# Patient Record
Sex: Male | Born: 1975
Health system: Southern US, Community
[De-identification: ages and names within clinical notes are randomized; demographics above are authoritative.]

## PROBLEM LIST (undated history)

## (undated) DIAGNOSIS — R569 Unspecified convulsions: Secondary | ICD-10-CM

## (undated) DIAGNOSIS — I10 Essential (primary) hypertension: Secondary | ICD-10-CM

## (undated) HISTORY — PX: OTHER SURGICAL HISTORY: SHX169

---

## 1998-03-06 ENCOUNTER — Emergency Department (HOSPITAL_COMMUNITY): Admission: EM | Admit: 1998-03-06 | Discharge: 1998-03-06 | Payer: Self-pay | Admitting: Internal Medicine

## 1999-03-11 ENCOUNTER — Emergency Department (HOSPITAL_COMMUNITY): Admission: EM | Admit: 1999-03-11 | Discharge: 1999-03-11 | Payer: Self-pay | Admitting: Emergency Medicine

## 1999-03-16 ENCOUNTER — Emergency Department (HOSPITAL_COMMUNITY): Admission: EM | Admit: 1999-03-16 | Discharge: 1999-03-16 | Payer: Self-pay | Admitting: Emergency Medicine

## 1999-12-10 ENCOUNTER — Emergency Department (HOSPITAL_COMMUNITY): Admission: EM | Admit: 1999-12-10 | Discharge: 1999-12-10 | Payer: Self-pay | Admitting: Emergency Medicine

## 2002-11-26 ENCOUNTER — Emergency Department (HOSPITAL_COMMUNITY): Admission: EM | Admit: 2002-11-26 | Discharge: 2002-11-26 | Payer: Self-pay | Admitting: Emergency Medicine

## 2003-11-03 ENCOUNTER — Emergency Department (HOSPITAL_COMMUNITY): Admission: EM | Admit: 2003-11-03 | Discharge: 2003-11-03 | Payer: Self-pay | Admitting: Emergency Medicine

## 2003-12-17 DIAGNOSIS — G40219 Localization-related (focal) (partial) symptomatic epilepsy and epileptic syndromes with complex partial seizures, intractable, without status epilepticus: Secondary | ICD-10-CM | POA: Insufficient documentation

## 2004-04-15 ENCOUNTER — Emergency Department (HOSPITAL_COMMUNITY): Admission: EM | Admit: 2004-04-15 | Discharge: 2004-04-15 | Payer: Self-pay | Admitting: Emergency Medicine

## 2006-02-05 ENCOUNTER — Emergency Department (HOSPITAL_COMMUNITY): Admission: EM | Admit: 2006-02-05 | Discharge: 2006-02-06 | Payer: Self-pay | Admitting: Emergency Medicine

## 2006-03-11 ENCOUNTER — Emergency Department (HOSPITAL_COMMUNITY): Admission: EM | Admit: 2006-03-11 | Discharge: 2006-03-12 | Payer: Self-pay | Admitting: Emergency Medicine

## 2006-10-27 ENCOUNTER — Emergency Department (HOSPITAL_COMMUNITY): Admission: EM | Admit: 2006-10-27 | Discharge: 2006-10-27 | Payer: Self-pay | Admitting: Emergency Medicine

## 2007-07-27 ENCOUNTER — Emergency Department (HOSPITAL_COMMUNITY): Admission: EM | Admit: 2007-07-27 | Discharge: 2007-07-27 | Payer: Self-pay | Admitting: Family Medicine

## 2008-02-19 ENCOUNTER — Emergency Department (HOSPITAL_COMMUNITY): Admission: EM | Admit: 2008-02-19 | Discharge: 2008-02-19 | Payer: Self-pay | Admitting: Emergency Medicine

## 2010-02-12 ENCOUNTER — Emergency Department (HOSPITAL_COMMUNITY)
Admission: EM | Admit: 2010-02-12 | Discharge: 2010-02-12 | Payer: Self-pay | Source: Home / Self Care | Admitting: Emergency Medicine

## 2010-02-12 LAB — URINALYSIS, ROUTINE W REFLEX MICROSCOPIC
Bilirubin Urine: NEGATIVE
Specific Gravity, Urine: 1.025 (ref 1.005–1.030)

## 2010-02-12 LAB — URINE MICROSCOPIC-ADD ON

## 2010-02-19 ENCOUNTER — Emergency Department (HOSPITAL_COMMUNITY)
Admission: EM | Admit: 2010-02-19 | Discharge: 2010-02-19 | Disposition: A | Discharge status: Home / Self Care | Payer: Medicare Other | Attending: Emergency Medicine | Admitting: Emergency Medicine

## 2010-02-19 DIAGNOSIS — R51 Headache: Secondary | ICD-10-CM | POA: Insufficient documentation

## 2010-02-19 DIAGNOSIS — S0990XA Unspecified injury of head, initial encounter: Secondary | ICD-10-CM | POA: Insufficient documentation

## 2010-02-19 DIAGNOSIS — Y9229 Other specified public building as the place of occurrence of the external cause: Secondary | ICD-10-CM | POA: Insufficient documentation

## 2010-02-19 DIAGNOSIS — R296 Repeated falls: Secondary | ICD-10-CM | POA: Insufficient documentation

## 2010-02-19 DIAGNOSIS — S0100XA Unspecified open wound of scalp, initial encounter: Secondary | ICD-10-CM | POA: Insufficient documentation

## 2010-02-19 LAB — POCT I-STAT, CHEM 8
BUN: 14 mg/dL (ref 6–23)
Calcium, Ion: 1.16 mmol/L (ref 1.12–1.32)
Chloride: 105 meq/L (ref 96–112)
Creatinine, Ser: 1.1 mg/dL (ref 0.4–1.5)
Glucose, Bld: 85 mg/dL (ref 70–99)
HCT: 43 % (ref 39.0–52.0)
Hemoglobin: 14.6 g/dL (ref 13.0–17.0)
Potassium: 3.3 meq/L — ABNORMAL LOW (ref 3.5–5.1)
Sodium: 141 mEq/L (ref 135–145)
TCO2: 23 mmol/L (ref 0–100)

## 2010-02-26 ENCOUNTER — Inpatient Hospital Stay (INDEPENDENT_AMBULATORY_CARE_PROVIDER_SITE_OTHER)
Admission: RE | Admit: 2010-02-26 | Discharge: 2010-02-26 | Disposition: A | Discharge status: Home / Self Care | Payer: Medicare Other | Source: Ambulatory Visit | Attending: Family Medicine | Admitting: Family Medicine

## 2010-02-26 DIAGNOSIS — Z4802 Encounter for removal of sutures: Secondary | ICD-10-CM

## 2010-06-03 NOTE — Op Note (Signed)
NAME:  Daniel Williams, Daniel Williams NO.:  [id number]   MEDICAL RECORD NO.:  [id number]          PATIENT TYPE:  EMS   LOCATION:  ED                           FACILITY:  [facility]   PHYSICIAN:  Cindee Salt, M.D.       DATE OF BIRTH:  1975/05/26   DATE OF PROCEDURE:  11/03/2003  DATE OF DISCHARGE:                                 OPERATIVE REPORT   PREOPERATIVE DIAGNOSIS:  Laceration extensor tendon left middle finger.   POSTOPERATIVE DIAGNOSIS:  Laceration extensor tendon left middle finger.   OPERATION:  Repair extensor tendon left middle finger.   SURGEON:  Cindee Salt, M.D.   ASSISTANTCarolyne Fiscal.   ANESTHESIA:  Metacarpal block.   HISTORY:  The patient is a 35 year old right hand dominant male who suffered  a laceration to the dorsal aspect of the middle phalanx of his left middle  finger with a box cutter at work.  He has an extensor tendon and is referred  from the emergency room.   PROCEDURE:  The patient was prepped and draped using Betadine solution.  The  wound was opened, had been irrigated.  The laceration to the extensor tendon  was immediately apparent.  This was repaired with a figure-of-eight 4-0  Mersilene suture.  The skin was repaired with interrupted 5-0 nylon sutures.  A sterile compressive dressing and splint to the finger was applied.  The  patient tolerated the procedure well.  He was discharged home, to return to  the hand center in [place] in one week, on Vicodin.      GK/MEDQ  D:  11/03/2003  T:  11/04/2003  Job:  [id number]   cc:   Cindee Salt, M.D.  [address]  [place]  [place] [address]  Fax: [phone]

## 2014-03-18 DIAGNOSIS — G40219 Localization-related (focal) (partial) symptomatic epilepsy and epileptic syndromes with complex partial seizures, intractable, without status epilepticus: Secondary | ICD-10-CM | POA: Diagnosis not present

## 2014-04-20 DIAGNOSIS — R454 Irritability and anger: Secondary | ICD-10-CM | POA: Diagnosis not present

## 2014-04-20 DIAGNOSIS — G40909 Epilepsy, unspecified, not intractable, without status epilepticus: Secondary | ICD-10-CM | POA: Diagnosis not present

## 2014-04-20 DIAGNOSIS — F70 Mild intellectual disabilities: Secondary | ICD-10-CM | POA: Diagnosis not present

## 2014-04-20 DIAGNOSIS — E782 Mixed hyperlipidemia: Secondary | ICD-10-CM | POA: Diagnosis not present

## 2014-07-10 DIAGNOSIS — R569 Unspecified convulsions: Secondary | ICD-10-CM | POA: Diagnosis not present

## 2014-08-19 DIAGNOSIS — G40909 Epilepsy, unspecified, not intractable, without status epilepticus: Secondary | ICD-10-CM | POA: Diagnosis not present

## 2014-08-19 DIAGNOSIS — R454 Irritability and anger: Secondary | ICD-10-CM | POA: Diagnosis not present

## 2014-09-02 DIAGNOSIS — G40219 Localization-related (focal) (partial) symptomatic epilepsy and epileptic syndromes with complex partial seizures, intractable, without status epilepticus: Secondary | ICD-10-CM | POA: Diagnosis not present

## 2014-09-02 DIAGNOSIS — Z79899 Other long term (current) drug therapy: Secondary | ICD-10-CM | POA: Diagnosis not present

## 2015-01-20 DIAGNOSIS — F70 Mild intellectual disabilities: Secondary | ICD-10-CM | POA: Diagnosis not present

## 2015-01-20 DIAGNOSIS — Z6820 Body mass index (BMI) 20.0-20.9, adult: Secondary | ICD-10-CM | POA: Diagnosis not present

## 2015-01-20 DIAGNOSIS — R454 Irritability and anger: Secondary | ICD-10-CM | POA: Diagnosis not present

## 2015-01-20 DIAGNOSIS — G40909 Epilepsy, unspecified, not intractable, without status epilepticus: Secondary | ICD-10-CM | POA: Diagnosis not present

## 2015-03-31 DIAGNOSIS — Z462 Encounter for fitting and adjustment of other devices related to nervous system and special senses: Secondary | ICD-10-CM | POA: Diagnosis not present

## 2015-03-31 DIAGNOSIS — F445 Conversion disorder with seizures or convulsions: Secondary | ICD-10-CM | POA: Insufficient documentation

## 2015-03-31 DIAGNOSIS — G40219 Localization-related (focal) (partial) symptomatic epilepsy and epileptic syndromes with complex partial seizures, intractable, without status epilepticus: Secondary | ICD-10-CM | POA: Diagnosis not present

## 2015-03-31 DIAGNOSIS — Z79899 Other long term (current) drug therapy: Secondary | ICD-10-CM | POA: Diagnosis not present

## 2015-05-27 DIAGNOSIS — H10413 Chronic giant papillary conjunctivitis, bilateral: Secondary | ICD-10-CM | POA: Diagnosis not present

## 2015-07-21 DIAGNOSIS — G40909 Epilepsy, unspecified, not intractable, without status epilepticus: Secondary | ICD-10-CM | POA: Diagnosis not present

## 2015-07-21 DIAGNOSIS — R454 Irritability and anger: Secondary | ICD-10-CM | POA: Diagnosis not present

## 2015-07-21 DIAGNOSIS — Z6820 Body mass index (BMI) 20.0-20.9, adult: Secondary | ICD-10-CM | POA: Diagnosis not present

## 2015-07-21 DIAGNOSIS — Z79899 Other long term (current) drug therapy: Secondary | ICD-10-CM | POA: Diagnosis not present

## 2015-07-21 DIAGNOSIS — F70 Mild intellectual disabilities: Secondary | ICD-10-CM | POA: Diagnosis not present

## 2015-07-22 ENCOUNTER — Emergency Department (HOSPITAL_COMMUNITY): Payer: PRIVATE HEALTH INSURANCE

## 2015-07-22 ENCOUNTER — Encounter (HOSPITAL_COMMUNITY): Payer: Self-pay | Admitting: Emergency Medicine

## 2015-07-22 ENCOUNTER — Emergency Department (HOSPITAL_COMMUNITY)
Admission: EM | Admit: 2015-07-22 | Discharge: 2015-07-22 | Disposition: A | Discharge status: Home / Self Care | Payer: PRIVATE HEALTH INSURANCE | Attending: Emergency Medicine | Admitting: Emergency Medicine

## 2015-07-22 DIAGNOSIS — Y999 Unspecified external cause status: Secondary | ICD-10-CM | POA: Insufficient documentation

## 2015-07-22 DIAGNOSIS — W19XXXA Unspecified fall, initial encounter: Secondary | ICD-10-CM | POA: Diagnosis not present

## 2015-07-22 DIAGNOSIS — Y939 Activity, unspecified: Secondary | ICD-10-CM | POA: Diagnosis not present

## 2015-07-22 DIAGNOSIS — S0993XA Unspecified injury of face, initial encounter: Secondary | ICD-10-CM | POA: Diagnosis not present

## 2015-07-22 DIAGNOSIS — R569 Unspecified convulsions: Secondary | ICD-10-CM | POA: Diagnosis not present

## 2015-07-22 DIAGNOSIS — S098XXA Other specified injuries of head, initial encounter: Secondary | ICD-10-CM | POA: Diagnosis not present

## 2015-07-22 DIAGNOSIS — S0081XA Abrasion of other part of head, initial encounter: Secondary | ICD-10-CM | POA: Diagnosis not present

## 2015-07-22 DIAGNOSIS — Y92521 Bus station as the place of occurrence of the external cause: Secondary | ICD-10-CM | POA: Diagnosis not present

## 2015-07-22 DIAGNOSIS — R220 Localized swelling, mass and lump, head: Secondary | ICD-10-CM | POA: Diagnosis not present

## 2015-07-22 DIAGNOSIS — R561 Post traumatic seizures: Secondary | ICD-10-CM | POA: Diagnosis not present

## 2015-07-22 DIAGNOSIS — S0031XA Abrasion of nose, initial encounter: Secondary | ICD-10-CM | POA: Insufficient documentation

## 2015-07-22 DIAGNOSIS — G40909 Epilepsy, unspecified, not intractable, without status epilepticus: Secondary | ICD-10-CM | POA: Diagnosis not present

## 2015-07-22 HISTORY — DX: Unspecified convulsions: R56.9

## 2015-07-22 LAB — CBC WITH DIFFERENTIAL/PLATELET
BASOS ABS: 0 10*3/uL (ref 0.0–0.1)
Basophils Relative: 0 %
Eosinophils Absolute: 0 10*3/uL (ref 0.0–0.7)
Eosinophils Relative: 0 %
HEMATOCRIT: 44.8 % (ref 39.0–52.0)
HEMOGLOBIN: 15.7 g/dL (ref 13.0–17.0)
LYMPHS PCT: 17 %
Lymphs Abs: 1.3 10*3/uL (ref 0.7–4.0)
MCH: 32.1 pg (ref 26.0–34.0)
MCHC: 35 g/dL (ref 30.0–36.0)
MCV: 91.6 fL (ref 78.0–100.0)
MONOS PCT: 2 %
Monocytes Absolute: 0.2 10*3/uL (ref 0.1–1.0)
Neutro Abs: 6.3 10*3/uL (ref 1.7–7.7)
Neutrophils Relative %: 81 %
Platelets: 176 10*3/uL (ref 150–400)
RBC: 4.89 MIL/uL (ref 4.22–5.81)
RDW: 12.1 % (ref 11.5–15.5)
WBC: 7.8 10*3/uL (ref 4.0–10.5)

## 2015-07-22 LAB — BASIC METABOLIC PANEL
ANION GAP: 7 (ref 5–15)
BUN: 11 mg/dL (ref 6–20)
CHLORIDE: 110 mmol/L (ref 101–111)
CO2: 24 mmol/L (ref 22–32)
Calcium: 9.7 mg/dL (ref 8.9–10.3)
Creatinine, Ser: 1.18 mg/dL (ref 0.61–1.24)
GFR calc Af Amer: >60 mL/min (ref >60)
GLUCOSE: 92 mg/dL (ref 65–99)
POTASSIUM: 4.5 mmol/L (ref 3.5–5.1)
Sodium: 141 mmol/L (ref 135–145)

## 2015-07-22 LAB — URINALYSIS, ROUTINE W REFLEX MICROSCOPIC
Bilirubin Urine: NEGATIVE
Glucose, UA: NEGATIVE mg/dL
Hgb urine dipstick: NEGATIVE
Ketones, ur: NEGATIVE mg/dL
LEUKOCYTES UA: NEGATIVE
NITRITE: NEGATIVE
PH: 6 (ref 5.0–8.0)
Protein, ur: NEGATIVE mg/dL
SPECIFIC GRAVITY, URINE: 1.027 (ref 1.005–1.030)

## 2015-07-22 LAB — CBG MONITORING, ED: GLUCOSE-CAPILLARY: 101 mg/dL — AB (ref 65–99)

## 2015-07-22 MED ORDER — BACITRACIN ZINC 500 UNIT/GM EX OINT: 1.0000 "application " | TOPICAL_OINTMENT | Freq: Two times a day (BID) | CUTANEOUS | Status: DC

## 2015-07-22 MED ORDER — TETANUS-DIPHTH-ACELL PERTUSSIS 5-2.5-18.5 LF-MCG/0.5 IM SUSP
0.5000 mL | Freq: Once | INTRAMUSCULAR | Status: AC
  Administered 2015-07-22: 0.5 mL via INTRAMUSCULAR
  Filled 2015-07-22: qty 0.5

## 2015-07-22 MED ORDER — ACETAMINOPHEN 500 MG PO TABS: 500.0000 mg | ORAL_TABLET | Freq: Four times a day (QID) | ORAL | Status: AC | PRN

## 2015-07-22 MED ORDER — SODIUM CHLORIDE 0.9 % IV BOLUS (SEPSIS)
1000.0000 mL | Freq: Once | INTRAVENOUS | Status: AC
  Administered 2015-07-22: 1000 mL via INTRAVENOUS

## 2015-07-22 NOTE — ED Notes (Signed)
EDP at bedside  

## 2015-07-22 NOTE — ED Provider Notes (Signed)
CSN: [id number]     Arrival date & time 07/22/15  1707 History   First MD Initiated Contact with Patient 07/22/15 1736     Chief Complaint  Patient presents with  . Seizures  . Facial Laceration   Daniel Williams is a 40 year old black male presents to ED after two seizures resulting in a fall. The patient was standing outside at the bus stop and had 2 witnessed seizures which resulted in two falls according to EMS. Patient complains of abrasions and soreness to his face and forehead, as well as his hands. Patient has a history of seizure and is compliant with his seizure medications. He sees his neurologist every 3-6 months and denies any recent changes to his medication regimen. His neurologist is in [place]. He states that his seizures have been triggered by heat and the patient was outside today. He believes that is what caused today's seizures. Last seizure was 3 months ago. He was not evaluated in the ED for this seizure. Patient complains of facial soreness around the abrasions but denies additional pain. Patient denies fever, recent illness, cough, numbness, tingling, weakness, changes to his vision, neck pain, CP, SOB, N/V, back pain, headache, rashes, double vision.    (Consider location/radiation/quality/duration/timing/severity/associated sxs/prior Treatment) HPI  Past Medical History  Diagnosis Date  . Seizures (HCC)    History reviewed. No pertinent past surgical history. No family history on file. Social History  Substance Use Topics  . Smoking status: Never Smoker   . Smokeless tobacco: None  . Alcohol Use: No    Review of Systems  Constitutional: Negative for fever and chills.  HENT: Negative for congestion, nosebleeds and sore throat.   Eyes: Negative for pain and visual disturbance.  Respiratory: Negative for cough, shortness of breath and wheezing.   Cardiovascular: Negative for chest pain.  Gastrointestinal: Negative for nausea, vomiting, abdominal pain and  diarrhea.  Genitourinary: Negative for dysuria.  Musculoskeletal: Negative for back pain and neck pain.  Skin: Negative for rash.  Neurological: Positive for seizures. Negative for dizziness, speech difficulty, weakness, light-headedness and headaches.      Allergies  Review of patient's allergies indicates no known allergies.  Home Medications   Prior to Admission medications   Medication Sig Start Date End Date Taking? Authorizing Provider  acetaminophen (TYLENOL) 500 MG tablet Take 1 tablet (500 mg total) by mouth every 6 (six) hours as needed for mild pain or moderate pain. 07/22/15   Everlene FarrierWilliam Jessly Lebeck, [name]  bacitracin ointment Apply 1 application topically 2 (two) times daily. 07/22/15   Everlene FarrierWilliam Tomeika Weinmann, [name]  carbamazepine (TEGRETOL-XR) 200 MG 12 hr tablet Take 400 mg by mouth in the morning and 600 mg at night 03/31/15  Yes Historical Provider, MD  levETIRAcetam (KEPPRA) 750 MG tablet Take 1,500 mg by mouth 2 (two) times daily. 03/31/15 03/30/16 Yes Historical Provider, MD  multivitamin (ONE-A-DAY MEN'S) TABS tablet Take 1 tablet by mouth every morning.   Yes Historical Provider, MD  zonisamide (ZONEGRAN) 100 MG capsule Take 400 mg by mouth at bedtime. 03/31/15  Yes Historical Provider, MD   BP 121/90 mmHg  Pulse 54  Temp(Src) 98.9 F (37.2 C) (Oral)  Resp 16  Ht 5\' 9"  (1.753 m)  Wt 61.236 kg  BMI 19.93 kg/m2  SpO2 100% Physical Exam  Constitutional: He is oriented to person, place, and time. He appears well-developed and well-nourished. No distress.  Nontoxic appearing.  HENT:  Head: Normocephalic.  Right Ear: External ear normal.  Left Ear: External ear normal.  Mouth/Throat: Oropharynx is clear and moist.  Patient with several abrasions noted to his bilateral forehead, nose, and chin. No facial bone tenderness. No zygomatic process tenderness. No crepitus to his face. No sign of entrapment. No epistaxis. No septal hematoma. Bilateral tympanic membranes are pearly-gray without  erythema or loss of landmarks.    Eyes: Conjunctivae and EOM are normal. Pupils are equal, round, and reactive to light. Right eye exhibits no discharge. Left eye exhibits no discharge.  Neck: Normal range of motion. Neck supple. No JVD present.  No midline neck tenderness. No crepitus.  Cardiovascular: Normal rate, regular rhythm, normal heart sounds and intact distal pulses.  Exam reveals no gallop and no friction rub.   No murmur heard. Bilateral radial and posterior tibialis pulses are intact.  Pulmonary/Chest: Effort normal and breath sounds normal. No stridor. No respiratory distress. He has no wheezes. He has no rales. He exhibits no tenderness.  Lungs are clear to auscultation bilaterally.  Abdominal: Soft. There is no tenderness. There is no guarding.  Abdomen is soft and nontender.  Musculoskeletal: He exhibits no edema or tenderness.  No midline neck or back tenderness. Good range of motion of his bilateral upper and lower extremities. Good strength in his bilateral upper and lower extremity.  Lymphadenopathy:    He has no cervical adenopathy.  Neurological: He is alert and oriented to person, place, and time. No cranial nerve deficit. Coordination normal.  The patient is alert and oriented 3. Cranial nerves are intact. Speech is clear and coherent. No pronator drip. Finger to nose intact bilaterally. EOMs are intact. Sensation is intact in his bilateral upper and lower external he's been good strength in his bilateral upper and lower extremities.  Skin: Skin is warm and dry. No rash noted. He is not diaphoretic. No erythema. No pallor.  See HEENT   Psychiatric: He has a normal mood and affect. His behavior is normal.  Nursing note and vitals reviewed.   ED Course  Procedures (including critical care time) Labs Review Labs Reviewed  CBG MONITORING, ED - Abnormal; Notable for the following:    Glucose-Capillary 101 (*)    All other components within normal limits  BASIC  METABOLIC PANEL  CBC WITH DIFFERENTIAL/PLATELET  URINALYSIS, ROUTINE W REFLEX MICROSCOPIC (NOT [facility])    Imaging Review Dg Nasal Bones  07/22/2015  CLINICAL DATA:  Fall striking face on concrete today. Nasal swelling and bleeding. EXAM: NASAL BONES - 3+ VIEW COMPARISON:  None. FINDINGS: There is no evidence of fracture or other bone abnormality. Nasal septum is midline. Soft tissue edema noted about the nasal bridge. IMPRESSION: Radiographically intact nasal bone.  No displaced fracture. Electronically Signed   By: Rubye OaksMelanie  Ehinger M.D.   On: 07/22/2015 22:39   I have personally reviewed and evaluated these images and lab results as part of my medical decision-making.   EKG Interpretation None      Filed Vitals:   07/22/15 2000 07/22/15 2100 07/22/15 2130 07/22/15 2319  BP: 128/82 113/80 117/87 121/90  Pulse: 78 51 74 54  Temp:      TempSrc:      Resp: 21 16 21 16   Height:      Weight:      SpO2: 99% 100% 100%      MDM   Meds given in ED:  Medications  Tdap (BOOSTRIX) injection 0.5 mL (0.5 mLs Intramuscular Given 07/22/15 1920)  sodium chloride 0.9 % bolus 1,000 mL (0  mLs Intravenous Stopped 07/22/15 2107)    Discharge Medication List as of 07/22/2015 11:20 PM    START taking these medications   Details  acetaminophen (TYLENOL) 500 MG tablet Take 1 tablet (500 mg total) by mouth every 6 (six) hours as needed for mild pain or moderate pain., Starting 07/22/2015, Until Discontinued, Print    bacitracin ointment Apply 1 application topically 2 (two) times daily., Starting 07/22/2015, Until Discontinued, Print        Final diagnoses:  Seizure (HCC)  Fall, initial encounter  Facial abrasion, initial encounter   This is a 40 year old black male presents to ED after two seizures resulting in a fall. The patient was standing outside at the bus stop and had 2 witnessed seizures which resulted in two falls according to EMS. Patient complains of abrasions and soreness to his face and  forehead, as well as his hands. Patient has a history of seizure and is compliant with his seizure medications. He sees his neurologist every 3-6 months and denies any recent changes to his medication regimen. His neurologist is in [place]. He states that his seizures have been triggered by heat and the patient was outside today. He believes that is what caused today's seizures. Last seizure was 3 months ago. He was not evaluated in the ED for this seizure. Patient complains of facial soreness around the abrasions but denies additional pain.  On exam the patient is afebrile nontoxic appearing. He has no focal neurological deficits. He does have abrasions noted to his forehead, the bridge of his nose, and his bilateral hands. Patient has mild tenderness over his nose but no crepitus or deformity. No septal hematoma. No facial bone tenderness. Nasal bone x-ray is unremarkable. No nasal fracture.  Urinalysis, BMP and CBC are all within normal limits. Patient's creatinine is 1.18 which is around his baseline and within normal limits. As the patient has no focal neurological deficits, no facial bone tenderness and normal blood work I see no need for further imaging at this time. Patient was observed for several hours in the emergency department without any additional seizure. He reports he's been compliant with his medication regimen. I encouraged him to follow-up closely with his neurologist and to ensure he is staying well-hydrated. He had wound care provided in the emergency department. There is no lacerations to repair. There is an abrasion/avulsion to the bridge of his nose. But no skin flap to repair. I educated on keeping these areas clean and bacitracin. I discussed infection precautions. I discussed strict and specific return precautions. I advised the patient to follow-up with their primary care provider this week. I advised the patient to return to the emergency department with new or worsening symptoms  or new concerns. The patient verbalized understanding and agreement with plan.    This patient was discussed with Dr. Silverio Lay who agrees with assessment and plan.    Everlene Farrier, [name] 07/23/15 0056  Richardean Canal, MD 07/23/15 [phone]

## 2015-07-22 NOTE — ED Notes (Signed)
EMS was called to bus stop after witnessed seizure/fall then another seizure w/ a fall.  Only complaint was facial pain as he does have facial lacerations.  He was ambulatory on scene and quickly became alert and oriented after his seizures.  Last seizure was "a couple months ago" and he reports he is complaint w/ medications for seizures.

## 2015-07-22 NOTE — Discharge Instructions (Signed)
Epilepsy °Epilepsy is a disorder in which a person has repeated seizures over time. A seizure is a release of abnormal electrical activity in the brain. Seizures can cause a change in attention, behavior, or the ability to remain awake and alert (altered mental status). Seizures often involve uncontrollable shaking (convulsions).  °Most people with epilepsy lead normal lives. However, people with epilepsy are at an increased risk of falls, accidents, and injuries. Therefore, it is important to begin treatment right away. °CAUSES  °Epilepsy has many possible causes. Anything that disturbs the normal pattern of brain cell activity can lead to seizures. This may include:  °· Head injury. °· Birth trauma. °· High fever as a child. °· Stroke. °· Bleeding into or around the brain. °· Certain drugs. °· Prolonged low oxygen, such as what occurs after CPR efforts. °· Abnormal brain development. °· Certain illnesses, such as meningitis, encephalitis (brain infection), malaria, and other infections. °· An imbalance of nerve signaling chemicals (neurotransmitters).   °SIGNS AND SYMPTOMS  °The symptoms of a seizure can vary greatly from one person to another. Right before a seizure, you may have a warning (aura) that a seizure is about to occur. An aura may include the following symptoms: °· Fear or anxiety. °· Nausea. °· Feeling like the room is spinning (vertigo). °· Vision changes, such as seeing flashing lights or spots. °Common symptoms during a seizure include: °· Abnormal sensations, such as an abnormal smell or a bitter taste in the mouth.   °· Sudden, general body stiffness.   °· Convulsions that involve rhythmic jerking of the face, arm, or leg on one or both sides.   °· Sudden change in consciousness.   °· Appearing to be awake but not responding.   °· Appearing to be asleep but cannot be awakened.   °· Grimacing, chewing, lip smacking, drooling, tongue biting, or loss of bowel or bladder control. °After a seizure,  you may feel sleepy for a while.  °DIAGNOSIS  °Your health care provider will ask about your symptoms and take a medical history. Descriptions from any witnesses to your seizures will be very helpful in the diagnosis. A physical exam, including a detailed neurological exam, is necessary. Various tests may be done, such as:  °· An electroencephalogram (EEG). This is a painless test of your brain waves. In this test, a diagram is created of your brain waves. These diagrams can be interpreted by a specialist. °· An MRI of the brain.   °· A CT scan of the brain.   °· A spinal tap (lumbar puncture, LP). °· Blood tests to check for signs of infection or abnormal blood chemistry. °TREATMENT  °There is no cure for epilepsy, but it is generally treatable. Once epilepsy is diagnosed, it is important to begin treatment as soon as possible. For most people with epilepsy, seizures can be controlled with medicines. The following may also be used: °· A pacemaker for the brain (vagus nerve stimulator) can be used for people with seizures that are not well controlled by medicine. °· Surgery on the brain. °For some people, epilepsy eventually goes away. °HOME CARE INSTRUCTIONS  °· Follow your health care provider's recommendations on driving and safety in normal activities. °· Get enough rest. Lack of sleep can cause seizures. °· Only take over-the-counter or prescription medicines as directed by your health care provider. Take any prescribed medicine exactly as directed. °· Avoid any known triggers of your seizures. °· Keep a seizure diary. Record what you recall about any seizure, especially any possible trigger.   °· Make   sure the people you live and work with know that you are prone to seizures. They should receive instructions on how to help you. In general, a witness to a seizure should:   Cushion your head and body.   Turn you on your side.   Avoid unnecessarily restraining you.   Not place anything inside your  mouth.   Call for emergency medical help if there is any question about what has occurred.   Follow up with your health care provider as directed. You may need regular blood tests to monitor the levels of your medicine.  SEEK MEDICAL CARE IF:   You develop signs of infection or other illness. This might increase the risk of a seizure.   You seem to be having more frequent seizures.   Your seizure pattern is changing.  SEEK IMMEDIATE MEDICAL CARE IF:   You have a seizure that does not stop after a few moments.   You have a seizure that causes any difficulty in breathing.   You have a seizure that results in a very severe headache.   You have a seizure that leaves you with the inability to speak or use a part of your body.    This information is not intended to replace advice given to you by your health care provider. Make sure you discuss any questions you have with your health care provider.   Document Released: 01/02/2005 Document Revised: 10/23/2012 Document Reviewed: 08/14/2012 Elsevier Interactive Patient Education 2016 Else[employer] Inc.  Abrasion An abrasion is a cut or scrape on the outer surface of your skin. An abrasion does not extend through all of the layers of your skin. It is important to care for your abrasion properly to prevent infection. CAUSES Most abrasions are caused by falling on or gliding across the ground or another surface. When your skin rubs on something, the outer and inner layer of skin rubs off.  SYMPTOMS A cut or scrape is the main symptom of this condition. The scrape may be bleeding, or it may appear red or pink. If there was an associated fall, there may be an underlying bruise. DIAGNOSIS An abrasion is diagnosed with a physical exam. TREATMENT Treatment for this condition depends on how large and deep the abrasion is. Usually, your abrasion will be cleaned with water and mild soap. This removes any dirt or debris that may be stuck. An  antibiotic ointment may be applied to the abrasion to help prevent infection. A bandage (dressing) may be placed on the abrasion to keep it clean. You may also need a tetanus shot. HOME CARE INSTRUCTIONS Medicines  Take or apply medicines only as directed by your health care provider.  If you were prescribed an antibiotic ointment, finish all of it even if you start to feel better. Wound Care  Clean the wound with mild soap and water 2-3 times per day or as directed by your health care provider. Pat your wound dry with a clean towel. Do not rub it.  There are many different ways to close and cover a wound. Follow instructions from your health care provider about:  Wound care.  Dressing changes and removal.  Check your wound every day for signs of infection. Watch for:  Redness, swelling, or pain.  Fluid, blood, or pus. General Instructions  Keep the dressing dry as directed by your health care provider. Do not take baths, swim, use a hot tub, or do anything that would put your wound underwater until your health  care provider approves.  If there is swelling, raise (elevate) the injured area above the level of your heart while you are sitting or lying down.  Keep all follow-up visits as directed by your health care provider. This is important. SEEK MEDICAL CARE IF:  You received a tetanus shot and you have swelling, severe pain, redness, or bleeding at the injection site.  Your pain is not controlled with medicine.  You have increased redness, swelling, or pain at the site of your wound. SEEK IMMEDIATE MEDICAL CARE IF:  You have a red streak going away from your wound.  You have a fever.  You have fluid, blood, or pus coming from your wound.  You notice a bad smell coming from your wound or your dressing.   This information is not intended to replace advice given to you by your health care provider. Make sure you discuss any questions you have with your health care  provider.   Document Released: 10/12/2004 Document Revised: 09/23/2014 Document Reviewed: 12/31/2013 Elsevier Interactive Patient Education [employer]

## 2015-07-22 NOTE — ED Notes (Signed)
Patient transported to X-ray 

## 2015-07-22 NOTE — ED Notes (Signed)
PA at bedside.

## 2015-10-20 DIAGNOSIS — G40219 Localization-related (focal) (partial) symptomatic epilepsy and epileptic syndromes with complex partial seizures, intractable, without status epilepticus: Secondary | ICD-10-CM | POA: Diagnosis not present

## 2015-10-20 DIAGNOSIS — F445 Conversion disorder with seizures or convulsions: Secondary | ICD-10-CM | POA: Diagnosis not present

## 2016-01-18 DIAGNOSIS — F70 Mild intellectual disabilities: Secondary | ICD-10-CM | POA: Diagnosis not present

## 2016-01-18 DIAGNOSIS — Z6821 Body mass index (BMI) 21.0-21.9, adult: Secondary | ICD-10-CM | POA: Diagnosis not present

## 2016-01-18 DIAGNOSIS — G40909 Epilepsy, unspecified, not intractable, without status epilepticus: Secondary | ICD-10-CM | POA: Diagnosis not present

## 2016-03-20 DIAGNOSIS — J029 Acute pharyngitis, unspecified: Secondary | ICD-10-CM | POA: Diagnosis not present

## 2016-04-26 DIAGNOSIS — F445 Conversion disorder with seizures or convulsions: Secondary | ICD-10-CM | POA: Diagnosis not present

## 2016-04-26 DIAGNOSIS — G40219 Localization-related (focal) (partial) symptomatic epilepsy and epileptic syndromes with complex partial seizures, intractable, without status epilepticus: Secondary | ICD-10-CM | POA: Diagnosis not present

## 2016-08-17 DIAGNOSIS — G40909 Epilepsy, unspecified, not intractable, without status epilepticus: Secondary | ICD-10-CM | POA: Diagnosis not present

## 2016-08-17 DIAGNOSIS — E786 Lipoprotein deficiency: Secondary | ICD-10-CM | POA: Diagnosis not present

## 2016-11-03 DIAGNOSIS — Z0000 Encounter for general adult medical examination without abnormal findings: Secondary | ICD-10-CM | POA: Diagnosis not present

## 2016-11-29 DIAGNOSIS — F445 Conversion disorder with seizures or convulsions: Secondary | ICD-10-CM | POA: Diagnosis not present

## 2016-11-29 DIAGNOSIS — Z79899 Other long term (current) drug therapy: Secondary | ICD-10-CM | POA: Diagnosis not present

## 2016-11-29 DIAGNOSIS — G40219 Localization-related (focal) (partial) symptomatic epilepsy and epileptic syndromes with complex partial seizures, intractable, without status epilepticus: Secondary | ICD-10-CM | POA: Diagnosis not present

## 2016-11-29 DIAGNOSIS — Z8249 Family history of ischemic heart disease and other diseases of the circulatory system: Secondary | ICD-10-CM | POA: Diagnosis not present

## 2016-11-29 DIAGNOSIS — Z820 Family history of epilepsy and other diseases of the nervous system: Secondary | ICD-10-CM | POA: Diagnosis not present

## 2016-11-29 DIAGNOSIS — Z833 Family history of diabetes mellitus: Secondary | ICD-10-CM | POA: Diagnosis not present

## 2016-11-29 DIAGNOSIS — Z9689 Presence of other specified functional implants: Secondary | ICD-10-CM | POA: Diagnosis not present

## 2017-01-17 DIAGNOSIS — F70 Mild intellectual disabilities: Secondary | ICD-10-CM | POA: Diagnosis not present

## 2017-01-17 DIAGNOSIS — G40909 Epilepsy, unspecified, not intractable, without status epilepticus: Secondary | ICD-10-CM | POA: Diagnosis not present

## 2017-01-17 DIAGNOSIS — E785 Hyperlipidemia, unspecified: Secondary | ICD-10-CM | POA: Diagnosis not present

## 2017-01-17 DIAGNOSIS — Z125 Encounter for screening for malignant neoplasm of prostate: Secondary | ICD-10-CM | POA: Diagnosis not present

## 2017-05-30 DIAGNOSIS — G40219 Localization-related (focal) (partial) symptomatic epilepsy and epileptic syndromes with complex partial seizures, intractable, without status epilepticus: Secondary | ICD-10-CM | POA: Diagnosis not present

## 2017-05-30 DIAGNOSIS — G40909 Epilepsy, unspecified, not intractable, without status epilepticus: Secondary | ICD-10-CM | POA: Diagnosis not present

## 2017-05-30 DIAGNOSIS — Z79899 Other long term (current) drug therapy: Secondary | ICD-10-CM | POA: Diagnosis not present

## 2017-05-30 DIAGNOSIS — Z9689 Presence of other specified functional implants: Secondary | ICD-10-CM | POA: Diagnosis not present

## 2017-06-25 DIAGNOSIS — G40219 Localization-related (focal) (partial) symptomatic epilepsy and epileptic syndromes with complex partial seizures, intractable, without status epilepticus: Secondary | ICD-10-CM | POA: Diagnosis not present

## 2017-06-25 DIAGNOSIS — Z01812 Encounter for preprocedural laboratory examination: Secondary | ICD-10-CM | POA: Diagnosis not present

## 2017-07-05 DIAGNOSIS — G40219 Localization-related (focal) (partial) symptomatic epilepsy and epileptic syndromes with complex partial seizures, intractable, without status epilepticus: Secondary | ICD-10-CM | POA: Diagnosis not present

## 2017-07-05 DIAGNOSIS — Z4542 Encounter for adjustment and management of neuropacemaker (brain) (peripheral nerve) (spinal cord): Secondary | ICD-10-CM | POA: Diagnosis not present

## 2017-07-17 DIAGNOSIS — J301 Allergic rhinitis due to pollen: Secondary | ICD-10-CM | POA: Diagnosis not present

## 2017-07-17 DIAGNOSIS — G40909 Epilepsy, unspecified, not intractable, without status epilepticus: Secondary | ICD-10-CM | POA: Diagnosis not present

## 2017-07-23 DIAGNOSIS — Z5189 Encounter for other specified aftercare: Secondary | ICD-10-CM | POA: Diagnosis not present

## 2017-07-23 DIAGNOSIS — Z09 Encounter for follow-up examination after completed treatment for conditions other than malignant neoplasm: Secondary | ICD-10-CM | POA: Diagnosis not present

## 2017-08-01 DIAGNOSIS — G40219 Localization-related (focal) (partial) symptomatic epilepsy and epileptic syndromes with complex partial seizures, intractable, without status epilepticus: Secondary | ICD-10-CM | POA: Diagnosis not present

## 2017-08-01 DIAGNOSIS — F445 Conversion disorder with seizures or convulsions: Secondary | ICD-10-CM | POA: Diagnosis not present

## 2017-10-31 DIAGNOSIS — F445 Conversion disorder with seizures or convulsions: Secondary | ICD-10-CM | POA: Diagnosis not present

## 2017-10-31 DIAGNOSIS — R490 Dysphonia: Secondary | ICD-10-CM | POA: Diagnosis not present

## 2017-10-31 DIAGNOSIS — G40219 Localization-related (focal) (partial) symptomatic epilepsy and epileptic syndromes with complex partial seizures, intractable, without status epilepticus: Secondary | ICD-10-CM | POA: Diagnosis not present

## 2017-10-31 DIAGNOSIS — Z79899 Other long term (current) drug therapy: Secondary | ICD-10-CM | POA: Diagnosis not present

## 2017-11-15 DIAGNOSIS — G40909 Epilepsy, unspecified, not intractable, without status epilepticus: Secondary | ICD-10-CM | POA: Diagnosis not present

## 2017-11-15 DIAGNOSIS — Z6820 Body mass index (BMI) 20.0-20.9, adult: Secondary | ICD-10-CM | POA: Diagnosis not present

## 2017-11-15 DIAGNOSIS — Z0000 Encounter for general adult medical examination without abnormal findings: Secondary | ICD-10-CM | POA: Diagnosis not present

## 2017-11-15 DIAGNOSIS — F70 Mild intellectual disabilities: Secondary | ICD-10-CM | POA: Diagnosis not present

## 2018-02-06 DIAGNOSIS — G40219 Localization-related (focal) (partial) symptomatic epilepsy and epileptic syndromes with complex partial seizures, intractable, without status epilepticus: Secondary | ICD-10-CM | POA: Diagnosis not present

## 2018-03-18 DIAGNOSIS — E119 Type 2 diabetes mellitus without complications: Secondary | ICD-10-CM | POA: Diagnosis not present

## 2018-03-18 DIAGNOSIS — F70 Mild intellectual disabilities: Secondary | ICD-10-CM | POA: Diagnosis not present

## 2018-03-18 DIAGNOSIS — E785 Hyperlipidemia, unspecified: Secondary | ICD-10-CM | POA: Diagnosis not present

## 2018-03-18 DIAGNOSIS — G40909 Epilepsy, unspecified, not intractable, without status epilepticus: Secondary | ICD-10-CM | POA: Diagnosis not present

## 2018-03-23 ENCOUNTER — Other Ambulatory Visit: Payer: Self-pay

## 2018-03-23 ENCOUNTER — Encounter (HOSPITAL_COMMUNITY): Payer: Self-pay | Admitting: Emergency Medicine

## 2018-03-23 ENCOUNTER — Emergency Department (HOSPITAL_COMMUNITY)
Admission: EM | Admit: 2018-03-23 | Discharge: 2018-03-23 | Disposition: A | Discharge status: Home / Self Care | Payer: Medicare HMO | Attending: Emergency Medicine | Admitting: Emergency Medicine

## 2018-03-23 DIAGNOSIS — Z79899 Other long term (current) drug therapy: Secondary | ICD-10-CM | POA: Diagnosis not present

## 2018-03-23 DIAGNOSIS — K6289 Other specified diseases of anus and rectum: Secondary | ICD-10-CM | POA: Diagnosis present

## 2018-03-23 DIAGNOSIS — K649 Unspecified hemorrhoids: Secondary | ICD-10-CM

## 2018-03-23 MED ORDER — PSYLLIUM 58.6 % PO POWD: 1.0000 | Freq: Three times a day (TID) | ORAL | 12 refills | Status: DC

## 2018-03-23 NOTE — ED Provider Notes (Signed)
[facility] EMERGENCY DEPARTMENT Provider Note   CSN: [id number] Arrival date & time: 03/23/18  2671    History   Chief Complaint Chief Complaint  Patient presents with  . Abscess    HPI Daniel Williams is a 43 y.o. male.     HPI   43 year old male presents today with complaints of rectal pain.  Patient notes 2 days ago he had a bowel movement and felt pain in his rectum.  He notes later on in the evening he felt a bulge at his rectal region.  He notes this has persisted since that time with no worsening.  He denies any nausea vomiting or fever.  He notes this was reminiscent of previous hemorrhoid but was slightly larger.  He reports using hemorrhoid cream without improvement in symptoms.  Past Medical History:  Diagnosis Date  . Seizures (HCC)     There are no active problems to display for this patient.   History reviewed. No pertinent surgical history.      Home Medications    Prior to Admission medications   Medication Sig Start Date End Date Taking? Authorizing Provider  acetaminophen (TYLENOL) 500 MG tablet Take 1 tablet (500 mg total) by mouth every 6 (six) hours as needed for mild pain or moderate pain. 07/22/15   Everlene Farrier, [name]  bacitracin ointment Apply 1 application topically 2 (two) times daily. 07/22/15   Everlene Farrier, [name]  carbamazepine (TEGRETOL-XR) 200 MG 12 hr tablet Take 400 mg by mouth in the morning and 600 mg at night 03/31/15   [provider]  levETIRAcetam (KEPPRA) 750 MG tablet Take 1,500 mg by mouth 2 (two) times daily. 03/31/15 03/30/16  [provider]  multivitamin (ONE-A-DAY MEN'S) TABS tablet Take 1 tablet by mouth every morning.    [provider]  psyllium (METAMUCIL SMOOTH TEXTURE) 58.6 % powder Take 1 packet by mouth 3 (three) times daily. 03/23/18   Aljean Horiuchi, Tinnie Gens, [name]  zonisamide (ZONEGRAN) 100 MG capsule Take 400 mg by mouth at bedtime. 03/31/15   [provider]     Family History History reviewed. No pertinent family history.  Social History Social History   Tobacco Use  . Smoking status: Never Smoker  . Smokeless tobacco: Never Used  Substance Use Topics  . Alcohol use: No  . Drug use: Not on file     Allergies   Patient has no known allergies.   Review of Systems Review of Systems  All other systems reviewed and are negative.    Physical Exam Updated Vital Signs BP (!) 140/97 (BP Location: Right Arm)   Pulse 89   Temp 98.7 F (37.1 C) (Oral)   Resp 16   SpO2 100%   Physical Exam Vitals signs and nursing note reviewed.  Constitutional:      Appearance: He is well-developed.  HENT:     Head: Normocephalic and atraumatic.  Eyes:     General: No scleral icterus.       Right eye: No discharge.        Left eye: No discharge.     Conjunctiva/sclera: Conjunctivae normal.     Pupils: Pupils are equal, round, and reactive to light.  Neck:     Musculoskeletal: Normal range of motion.     Vascular: No JVD.     Trachea: No tracheal deviation.  Pulmonary:     Effort: Pulmonary effort is normal.     Breath sounds: No stridor.  Abdominal:  General: There is no distension.     Palpations: Abdomen is soft.     Tenderness: There is no abdominal tenderness. There is no guarding.  Genitourinary:    Comments: Nonthrombosed external hemorrhoid at the 3 o'clock position-no bleeding-perirectal region nontender to palpation-unable to complete internal exam secondary to discomfort Neurological:     Mental Status: He is alert and oriented to person, place, and time.     Coordination: Coordination normal.  Psychiatric:        Behavior: Behavior normal.        Thought Content: Thought content normal.        Judgment: Judgment normal.      ED Treatments / Results  Labs (all labs ordered are listed, but only abnormal results are displayed) Labs Reviewed - No data to display  EKG None  Radiology No results  found.  Procedures Procedures (including critical care time)  Medications Ordered in ED Medications - No data to display   Initial Impression / Assessment and Plan / ED Course  I have reviewed the triage vital signs and the nursing notes.  Pertinent labs & imaging results that were available during my care of the patient were reviewed by me and considered in my medical decision making (see chart for details).        43 year old male presents today with hemorrhoid.  Patient's history and physical consistent with external hemorrhoid, I have very low suspicion for any infectious etiology including abscess.  Will follow-up in the next 3 days with his primary care provider if symptoms persist he will return immediately if they worsen.  He verbalized understanding and agreement to today's plan had no further questions or concerns.  Final Clinical Impressions(s) / ED Diagnoses   Final diagnoses:  Hemorrhoids, unspecified hemorrhoid type    ED Discharge Orders         Ordered    psyllium (METAMUCIL SMOOTH TEXTURE) 58.6 % powder  3 times daily     03/23/18 0743           Eyvonne Mechanic, [name] 03/23/18 3291    Linwood Dibbles, MD 03/24/18 1126

## 2018-03-23 NOTE — ED Triage Notes (Signed)
Pt reports "abcess" x a few days on his rectum.

## 2018-03-23 NOTE — Discharge Instructions (Signed)
Please read the attached information.  Please use Tylenol or ibuprofen as needed for discomfort.  Continue using your hemorrhoid cream or Anusol as needed for itching and discomfort.  Please use Metamucil, drink plenty of water, increase your fruits and vegetable intake.  Please avoid sitting on the toilet for prolonged periods of time or straining.

## 2018-03-23 NOTE — ED Notes (Signed)
Patient verbalized understanding of dc instructions, vss, ambulatory with nad.   

## 2018-04-23 DIAGNOSIS — R69 Illness, unspecified: Secondary | ICD-10-CM | POA: Diagnosis not present

## 2018-07-10 DIAGNOSIS — Z20828 Contact with and (suspected) exposure to other viral communicable diseases: Secondary | ICD-10-CM | POA: Diagnosis not present

## 2018-07-18 DIAGNOSIS — F70 Mild intellectual disabilities: Secondary | ICD-10-CM | POA: Diagnosis not present

## 2018-07-18 DIAGNOSIS — G40909 Epilepsy, unspecified, not intractable, without status epilepticus: Secondary | ICD-10-CM | POA: Diagnosis not present

## 2018-07-18 DIAGNOSIS — B342 Coronavirus infection, unspecified: Secondary | ICD-10-CM | POA: Diagnosis not present

## 2018-08-07 DIAGNOSIS — G40219 Localization-related (focal) (partial) symptomatic epilepsy and epileptic syndromes with complex partial seizures, intractable, without status epilepticus: Secondary | ICD-10-CM | POA: Diagnosis not present

## 2018-08-07 DIAGNOSIS — Z8659 Personal history of other mental and behavioral disorders: Secondary | ICD-10-CM | POA: Diagnosis not present

## 2018-11-18 DIAGNOSIS — G40909 Epilepsy, unspecified, not intractable, without status epilepticus: Secondary | ICD-10-CM | POA: Diagnosis not present

## 2018-11-18 DIAGNOSIS — E782 Mixed hyperlipidemia: Secondary | ICD-10-CM | POA: Diagnosis not present

## 2018-11-18 DIAGNOSIS — B342 Coronavirus infection, unspecified: Secondary | ICD-10-CM | POA: Diagnosis not present

## 2018-11-18 DIAGNOSIS — Z7189 Other specified counseling: Secondary | ICD-10-CM | POA: Diagnosis not present

## 2018-12-04 DIAGNOSIS — Z0000 Encounter for general adult medical examination without abnormal findings: Secondary | ICD-10-CM | POA: Diagnosis not present

## 2018-12-25 DIAGNOSIS — H1045 Other chronic allergic conjunctivitis: Secondary | ICD-10-CM | POA: Diagnosis not present

## 2019-03-25 DIAGNOSIS — R454 Irritability and anger: Secondary | ICD-10-CM | POA: Diagnosis not present

## 2019-03-25 DIAGNOSIS — G40909 Epilepsy, unspecified, not intractable, without status epilepticus: Secondary | ICD-10-CM | POA: Diagnosis not present

## 2019-03-25 DIAGNOSIS — F70 Mild intellectual disabilities: Secondary | ICD-10-CM | POA: Diagnosis not present

## 2019-03-29 DIAGNOSIS — R58 Hemorrhage, not elsewhere classified: Secondary | ICD-10-CM | POA: Diagnosis not present

## 2019-04-02 DIAGNOSIS — Z79899 Other long term (current) drug therapy: Secondary | ICD-10-CM | POA: Diagnosis not present

## 2019-04-02 DIAGNOSIS — R569 Unspecified convulsions: Secondary | ICD-10-CM | POA: Diagnosis not present

## 2019-04-02 DIAGNOSIS — Z820 Family history of epilepsy and other diseases of the nervous system: Secondary | ICD-10-CM | POA: Diagnosis not present

## 2019-04-02 DIAGNOSIS — Z8616 Personal history of COVID-19: Secondary | ICD-10-CM | POA: Diagnosis not present

## 2019-04-02 DIAGNOSIS — G40219 Localization-related (focal) (partial) symptomatic epilepsy and epileptic syndromes with complex partial seizures, intractable, without status epilepticus: Secondary | ICD-10-CM | POA: Diagnosis not present

## 2019-07-25 DIAGNOSIS — R569 Unspecified convulsions: Secondary | ICD-10-CM | POA: Diagnosis not present

## 2019-07-25 DIAGNOSIS — E119 Type 2 diabetes mellitus without complications: Secondary | ICD-10-CM | POA: Diagnosis not present

## 2019-07-25 DIAGNOSIS — G40909 Epilepsy, unspecified, not intractable, without status epilepticus: Secondary | ICD-10-CM | POA: Diagnosis not present

## 2019-07-25 DIAGNOSIS — I10 Essential (primary) hypertension: Secondary | ICD-10-CM | POA: Diagnosis not present

## 2019-09-03 DIAGNOSIS — F39 Unspecified mood [affective] disorder: Secondary | ICD-10-CM | POA: Diagnosis not present

## 2019-09-03 DIAGNOSIS — G40219 Localization-related (focal) (partial) symptomatic epilepsy and epileptic syndromes with complex partial seizures, intractable, without status epilepticus: Secondary | ICD-10-CM | POA: Diagnosis not present

## 2019-09-03 DIAGNOSIS — S0990XS Unspecified injury of head, sequela: Secondary | ICD-10-CM | POA: Diagnosis not present

## 2019-09-03 DIAGNOSIS — Z79899 Other long term (current) drug therapy: Secondary | ICD-10-CM | POA: Diagnosis not present

## 2019-09-26 DIAGNOSIS — G40119 Localization-related (focal) (partial) symptomatic epilepsy and epileptic syndromes with simple partial seizures, intractable, without status epilepticus: Secondary | ICD-10-CM | POA: Diagnosis not present

## 2019-09-26 DIAGNOSIS — S0990XS Unspecified injury of head, sequela: Secondary | ICD-10-CM | POA: Diagnosis not present

## 2019-09-26 DIAGNOSIS — S0990XA Unspecified injury of head, initial encounter: Secondary | ICD-10-CM | POA: Diagnosis not present

## 2019-09-26 DIAGNOSIS — J3489 Other specified disorders of nose and nasal sinuses: Secondary | ICD-10-CM | POA: Diagnosis not present

## 2019-09-26 DIAGNOSIS — M270 Developmental disorders of jaws: Secondary | ICD-10-CM | POA: Diagnosis not present

## 2019-09-26 DIAGNOSIS — G40219 Localization-related (focal) (partial) symptomatic epilepsy and epileptic syndromes with complex partial seizures, intractable, without status epilepticus: Secondary | ICD-10-CM | POA: Diagnosis not present

## 2019-09-26 DIAGNOSIS — F39 Unspecified mood [affective] disorder: Secondary | ICD-10-CM | POA: Diagnosis not present

## 2019-10-02 DIAGNOSIS — R229 Localized swelling, mass and lump, unspecified: Secondary | ICD-10-CM | POA: Diagnosis not present

## 2019-10-02 DIAGNOSIS — S6991XS Unspecified injury of right wrist, hand and finger(s), sequela: Secondary | ICD-10-CM | POA: Diagnosis not present

## 2019-10-21 DIAGNOSIS — Z23 Encounter for immunization: Secondary | ICD-10-CM | POA: Diagnosis not present

## 2019-10-21 DIAGNOSIS — Z0000 Encounter for general adult medical examination without abnormal findings: Secondary | ICD-10-CM | POA: Diagnosis not present

## 2020-02-04 DIAGNOSIS — F445 Conversion disorder with seizures or convulsions: Secondary | ICD-10-CM | POA: Diagnosis not present

## 2020-02-04 DIAGNOSIS — G40219 Localization-related (focal) (partial) symptomatic epilepsy and epileptic syndromes with complex partial seizures, intractable, without status epilepticus: Secondary | ICD-10-CM | POA: Diagnosis not present

## 2020-02-16 DIAGNOSIS — G8929 Other chronic pain: Secondary | ICD-10-CM | POA: Diagnosis not present

## 2020-02-16 DIAGNOSIS — G40219 Localization-related (focal) (partial) symptomatic epilepsy and epileptic syndromes with complex partial seizures, intractable, without status epilepticus: Secondary | ICD-10-CM | POA: Diagnosis not present

## 2020-02-16 DIAGNOSIS — M545 Low back pain, unspecified: Secondary | ICD-10-CM | POA: Diagnosis not present

## 2020-02-16 DIAGNOSIS — I10 Essential (primary) hypertension: Secondary | ICD-10-CM | POA: Diagnosis not present

## 2020-03-11 DIAGNOSIS — Z0000 Encounter for general adult medical examination without abnormal findings: Secondary | ICD-10-CM | POA: Diagnosis not present

## 2020-03-11 DIAGNOSIS — I10 Essential (primary) hypertension: Secondary | ICD-10-CM | POA: Diagnosis not present

## 2020-04-21 DIAGNOSIS — G40219 Localization-related (focal) (partial) symptomatic epilepsy and epileptic syndromes with complex partial seizures, intractable, without status epilepticus: Secondary | ICD-10-CM | POA: Diagnosis not present

## 2020-05-05 DIAGNOSIS — S86911A Strain of unspecified muscle(s) and tendon(s) at lower leg level, right leg, initial encounter: Secondary | ICD-10-CM | POA: Insufficient documentation

## 2020-05-05 DIAGNOSIS — I10 Essential (primary) hypertension: Secondary | ICD-10-CM | POA: Diagnosis not present

## 2020-05-11 DIAGNOSIS — E46 Unspecified protein-calorie malnutrition: Secondary | ICD-10-CM | POA: Diagnosis not present

## 2020-05-11 DIAGNOSIS — G40909 Epilepsy, unspecified, not intractable, without status epilepticus: Secondary | ICD-10-CM | POA: Diagnosis not present

## 2020-05-11 DIAGNOSIS — I10 Essential (primary) hypertension: Secondary | ICD-10-CM | POA: Diagnosis not present

## 2020-05-11 DIAGNOSIS — Z0000 Encounter for general adult medical examination without abnormal findings: Secondary | ICD-10-CM | POA: Diagnosis not present

## 2020-05-11 DIAGNOSIS — Z008 Encounter for other general examination: Secondary | ICD-10-CM | POA: Diagnosis not present

## 2021-06-12 ENCOUNTER — Other Ambulatory Visit: Payer: Self-pay

## 2021-06-12 ENCOUNTER — Encounter (HOSPITAL_COMMUNITY): Payer: Self-pay | Admitting: Emergency Medicine

## 2021-06-12 ENCOUNTER — Emergency Department (HOSPITAL_COMMUNITY): Payer: Medicare HMO

## 2021-06-12 ENCOUNTER — Emergency Department (HOSPITAL_COMMUNITY)
Admission: EM | Admit: 2021-06-12 | Discharge: 2021-06-12 | Disposition: A | Discharge status: Home / Self Care | Payer: Medicare HMO | Attending: Emergency Medicine | Admitting: Emergency Medicine

## 2021-06-12 DIAGNOSIS — M25521 Pain in right elbow: Secondary | ICD-10-CM | POA: Insufficient documentation

## 2021-06-12 HISTORY — DX: Essential (primary) hypertension: I10

## 2021-06-12 MED ORDER — MELOXICAM 7.5 MG PO TABS: 7.5000 mg | ORAL_TABLET | Freq: Every day | ORAL | 0 refills | Status: DC

## 2021-06-12 NOTE — Discharge Instructions (Signed)
Please read and follow all provided instructions.  Your diagnoses today include:  1. Right elbow pain    Tests performed today include: An x-ray of the affected area - does NOT show any broken bones Vital signs. See below for your results today.   Medications prescribed:  Meloxicam - anti-inflammatory pain medication  You have been prescribed an anti-inflammatory medication or NSAID. Take with food. Do not take aspirin, ibuprofen, or naproxen if taking this medication. Take smallest effective dose for the shortest duration needed for your pain. Stop taking if you experience stomach pain or vomiting.   Take any prescribed medications only as directed.  Home care instructions:  Follow any educational materials contained in this packet Follow R.I.C.E. Protocol: R - rest your injury  I  - use ice on injury without applying directly to skin C - compress injury with bandage or splint E - elevate the injury as much as possible  Follow-up instructions: Please follow-up with your primary care provider or the provided orthopedic physician (bone specialist) if you continue to have significant pain in 1 week. In this case you may have a more severe injury that requires further care.   Return instructions:  Please return if your fingers are numb or tingling, appear gray or blue, or you have severe pain (also elevate the arm and loosen splint or wrap if you were given one) Please return to the Emergency Department if you experience worsening symptoms.  Please return if you have any other emergent concerns.  Additional Information:  Your vital signs today were: BP (!) 152/106 (BP Location: Left Arm)   Pulse 91   Temp 98.3 F (36.8 C) (Oral)   Resp 18   SpO2 100%  If your blood pressure (BP) was elevated above 135/85 this visit, please have this repeated by your doctor within one month. --------------

## 2021-06-12 NOTE — ED Triage Notes (Signed)
C/o R elbow pain that radiates to R shoulder x 2 days. No known injury.

## 2021-06-12 NOTE — ED Provider Notes (Signed)
[facility] EMERGENCY DEPARTMENT Provider Note   CSN: [id number] Arrival date & time: 06/12/21  1702     History  Chief Complaint  Patient presents with   Elbow Pain    Daniel Williams is a 46 y.o. male.  Patient with no STEMI past medical history presents to the emergency department for evaluation of right elbow pain.  Patient has had waxing and waning elbow pain but has been worse over the past 2 to 3 days.  He denies acute injuries.  Pain is in the inside of the elbow and then shoots up towards the shoulder at times.  He has had some tingling sensation in his middle and ring fingers as well.  No weakness in the arm.  He does report staying active and uses his upper extremities a lot.  No treatments prior to arrival.      Home Medications Prior to Admission medications   Medication Sig Start Date End Date Taking? Authorizing Provider  meloxicam (MOBIC) 7.5 MG tablet Take 1 tablet (7.5 mg total) by mouth daily. 06/12/21  Yes Renne Crigler, [name]  acetaminophen (TYLENOL) 500 MG tablet Take 1 tablet (500 mg total) by mouth every 6 (six) hours as needed for mild pain or moderate pain. 07/22/15   Everlene Farrier, [name]  bacitracin ointment Apply 1 application topically 2 (two) times daily. 07/22/15   Everlene Farrier, [name]  carbamazepine (TEGRETOL-XR) 200 MG 12 hr tablet Take 400 mg by mouth in the morning and 600 mg at night 03/31/15   [provider]  levETIRAcetam (KEPPRA) 750 MG tablet Take 1,500 mg by mouth 2 (two) times daily. 03/31/15 03/30/16  [provider]  multivitamin (ONE-A-DAY MEN'S) TABS tablet Take 1 tablet by mouth every morning.    [provider]  psyllium (METAMUCIL SMOOTH TEXTURE) 58.6 % powder Take 1 packet by mouth 3 (three) times daily. 03/23/18   [name], Tinnie Gens, [name]  zonisamide (ZONEGRAN) 100 MG capsule Take 400 mg by mouth at bedtime. 03/31/15   [provider]      Allergies    Patient has no known allergies.     Review of Systems   Review of Systems  Physical Exam Updated Vital Signs BP (!) 152/106 (BP Location: Left Arm)   Pulse 91   Temp 98.3 F (36.8 C) (Oral)   Resp 18   SpO2 100%   Physical Exam Vitals and nursing note reviewed.  Constitutional:      Appearance: He is well-developed.  HENT:     Head: Normocephalic and atraumatic.  Eyes:     Conjunctiva/sclera: Conjunctivae normal.  Cardiovascular:     Pulses: Normal pulses. No decreased pulses.          Radial pulses are 2+ on the right side and 2+ on the left side.     Comments: Capillary refill less than 2 seconds in digits of right hand Musculoskeletal:        General: Tenderness present.     Left shoulder: No tenderness. Normal range of motion.     Left upper arm: Normal.     Left elbow: No effusion. Normal range of motion. Tenderness present in medial epicondyle.     Left forearm: Normal. No tenderness or bony tenderness.     Left wrist: No tenderness. Normal range of motion.     Cervical back: Normal range of motion and neck supple. No tenderness or bony tenderness. Normal range of motion.     Right lower leg: No  edema.     Left lower leg: No edema.  Skin:    General: Skin is warm and dry.  Neurological:     Mental Status: He is alert.     Sensory: No sensory deficit.     Comments: Motor, sensation, and vascular distal to the injury is fully intact.   Psychiatric:        Mood and Affect: Mood normal.    ED Results / Procedures / Treatments   Labs (all labs ordered are listed, but only abnormal results are displayed) Labs Reviewed - No data to display  EKG None  Radiology DG Elbow Complete Right  Result Date: 06/12/2021 CLINICAL DATA:  Elbow pain EXAM: RIGHT ELBOW - COMPLETE 3+ VIEW COMPARISON:  None Available. FINDINGS: There is no joint effusion. No signs of acute fracture or dislocation. No significant arthropathy. Soft tissues are unremarkable. IMPRESSION: Negative. Electronically Signed   By: Signa Kell M.D.   On: 06/12/2021 18:07    Procedures Procedures    Medications Ordered in ED Medications - No data to display  ED Course/ Medical Decision Making/ A&P    Patient seen and examined. History obtained directly from patient.   Labs/EKG: None ordered  Imaging: X-ray of the right elbow  Medications/Fluids: None ordered  Most recent vital signs reviewed and are as follows: BP (!) 152/106 (BP Location: Left Arm)   Pulse 91   Temp 98.3 F (36.8 C) (Oral)   Resp 18   SpO2 100%   Initial impression: Right elbow pain  7:04 PM Reassessment performed. Patient appears stable.  Imaging personally visualized and interpreted including: X-ray of the elbow, agree no acute findings.  Reviewed pertinent lab work and imaging with patient at bedside. Questions answered.   Most current vital signs reviewed and are as follows: BP (!) 152/106 (BP Location: Left Arm)   Pulse 91   Temp 98.3 F (36.8 C) (Oral)   Resp 18   SpO2 100%   Plan: Discharge to home.   Prescriptions written for: Meloxicam  Other home care instructions discussed: RICE protocol, NSAIDs  ED return instructions discussed: Return with worsening numbness or pain  Follow-up instructions discussed: Patient encouraged to follow-up with their PCP or orthopedic referral in 1 week.                           Medical Decision Making Amount and/or Complexity of Data Reviewed Radiology: ordered.  Risk Prescription drug management.   Patient with right elbow pain, intermittent.  No effusion on exam.  X-ray negative for fracture or dislocation.  No signs of cellulitis or abscess.  No signs of bursitis.  No signs of compartment syndrome.  Distal extremity is neurovascularly intact at this time.  No shoulder or C-spine tenderness.          Final Clinical Impression(s) / ED Diagnoses Final diagnoses:  Right elbow pain    Rx / DC Orders ED Discharge Orders          Ordered    meloxicam (MOBIC) 7.5  MG tablet  Daily        06/12/21 1852              Renne Crigler, Cordelia Poche 06/12/21 1906    Glynn Octave, MD 06/12/21 2304

## 2022-04-12 ENCOUNTER — Encounter: Payer: Self-pay | Admitting: Gastroenterology

## 2022-04-17 ENCOUNTER — Encounter: Payer: Self-pay | Admitting: Neurology

## 2022-04-27 ENCOUNTER — Ambulatory Visit (AMBULATORY_SURGERY_CENTER): Payer: Medicare HMO

## 2022-04-27 ENCOUNTER — Telehealth: Payer: Self-pay

## 2022-04-27 VITALS — Ht 69.0 in | Wt 135.0 lb

## 2022-04-27 DIAGNOSIS — Z1211 Encounter for screening for malignant neoplasm of colon: Secondary | ICD-10-CM

## 2022-04-27 MED ORDER — NA SULFATE-K SULFATE-MG SULF 17.5-3.13-1.6 GM/177ML PO SOLN: 1.0000 | Freq: Once | ORAL | 0 refills | Status: AC

## 2022-04-27 NOTE — Progress Notes (Signed)
No egg or soy allergy known to patient  No issues known to pt with past sedation with any surgeries or procedures Patient denies ever being told they had issues or difficulty with intubation  No FH of Malignant Hyperthermia Pt is not on diet pills Pt is not on  home 02  Pt is not on blood thinners  Pt denies issues with constipation  No A fib or A flutter Have any cardiac testing pending--no Pt instructed to use Singlecare.com or GoodRx for a price reduction on prep   

## 2022-04-28 NOTE — Telephone Encounter (Signed)
Thanks Gracelyn Nurse can proceed as scheduled in the [facility]. Thanks

## 2022-04-28 NOTE — Telephone Encounter (Signed)
Daniel Williams, thanks for making me aware.  Daniel Williams this patient has a known dx of epilepsy and sees Neurology routinely - please see their note from March. Can you let me know what you think and if he is a candidate to be done in the [facility]? Thanks

## 2022-05-18 ENCOUNTER — Encounter: Payer: Self-pay | Admitting: Neurology

## 2022-05-18 ENCOUNTER — Ambulatory Visit (INDEPENDENT_AMBULATORY_CARE_PROVIDER_SITE_OTHER): Payer: Medicare HMO | Admitting: Neurology

## 2022-05-18 VITALS — BP 129/83 | HR 72 | Ht 69.0 in | Wt 129.6 lb

## 2022-05-18 DIAGNOSIS — G40219 Localization-related (focal) (partial) symptomatic epilepsy and epileptic syndromes with complex partial seizures, intractable, without status epilepticus: Secondary | ICD-10-CM | POA: Diagnosis not present

## 2022-05-18 MED ORDER — CARBAMAZEPINE ER 300 MG PO CP12: ORAL_CAPSULE | ORAL | 11 refills | Status: DC

## 2022-05-18 MED ORDER — ZONISAMIDE 100 MG PO CAPS: ORAL_CAPSULE | ORAL | 11 refills | Status: DC

## 2022-05-18 MED ORDER — LEVETIRACETAM 750 MG PO TABS: ORAL_TABLET | ORAL | 11 refills | Status: DC

## 2022-05-18 NOTE — Patient Instructions (Addendum)
Good to meet you.  Schedule 1-hour EEG  Increase Zonisamide 100mg : take 5 capsules every night  3. Continue Carbamazepine ER 300mg : take 1 capsule in AM, 2 capsules in PM  4. Continue Levetiracetam 750mg : take 2 tablets twice a day  5. Keep a calendar of the seizures. Swipe your magnet when you have the seizures to help either stop or shorten a seizure  6. Follow-up in 3 months, call for any changes   Seizure Precautions: 1. If medication has been prescribed for you to prevent seizures, take it exactly as directed.  Do not stop taking the medicine without talking to your doctor first, even if you have not had a seizure in a long time.   2. Avoid activities in which a seizure would cause danger to yourself or to others.  Don't operate dangerous machinery, swim alone, or climb in high or dangerous places, such as on ladders, roofs, or girders.  Do not drive unless your doctor says you may.  3. If you have any warning that you may have a seizure, lay down in a safe place where you can't hurt yourself.    4.  No driving for 6 months from last seizure, as per [facility].   Please refer to the following link on the [employer]'s website for more information: http://www.epilepsyfoundation.org/answerplace/Social/driving/drivingu.cfm   5.  Maintain good sleep hygiene. Avoid alcohol.  6.  Contact your doctor if you have any problems that may be related to the medicine you are taking.  7.  Call 911 and bring the patient back to the ED if:        A.  The seizure lasts longer than 5 minutes.       B.  The patient doesn't awaken shortly after the seizure  C.  The patient has new problems such as difficulty seeing, speaking or moving  D.  The patient was injured during the seizure  E.  The patient has a temperature over 102 F (39C)  F.  The patient vomited and now is having trouble breathing

## 2022-05-18 NOTE — Progress Notes (Unsigned)
NEUROLOGY CONSULTATION NOTE  Daniel Williams MRN: [id number] DOB: [birth year]  Referring provider: Dr. Tracey Williams Primary care provider: Dr. Tracey Williams  Reason for consult:  establish local care for epilepsy  Dear Dr Daniel Williams Other:  Thank you for your kind referral of Daniel Williams for consultation of the above symptoms. Although his history is well known to you, please allow me to reiterate it for the purpose of our medical record. He is alone in the office today. Records and images were personally reviewed where available.   HISTORY OF PRESENT ILLNESS: This is a 46 year old right-handed man with a history of hypertension, co-existing intractable epilepsy s/p VNS implantation and non-epileptic events, presenting to establish care. Records were reviewed. He reports seizures started in childhood. Sometimes he gets a feeling and knows he needs to sit down, or his eyes start messing up, then he wakes up with injuries. He always bites inside of his gums on the left, last occurrence was a couple of weeks ago. He reports waking up with injuries on his back, left ear a couple of weeks ago. He lives alone. No focal weakness, he feels drained and tired after. He has difficulty determining seizure frequency but states he does not have them like before, he thinks he had 4 seizures in the last 2 months. Seizure triggers include stress, getting worked up, heat. He denies any olfactory/gustatory hallucinations, deja vu, rising epigastric sensation, focal numbness/tingling/weakness, myoclonic jerks. He has migraines once in a while with good response to Tylenol or Ibuprofen. No nausea/vomiting, photo/phonophobia. There is a strong family history of migraines in his mother, maternal grandfather, and paternal grandmother). No dizziness, diplopia, dysarthria/dysphagia, neck pain. He has some back pain and occasional constipation. He gets around 12 hours of sleep. He lives alone. 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 He does not drive.   Records from [facility] were reviewed, last visit with epileptologist Dr. Craig Williams was in 03/2022. Per records, Zonisamide was increased to 500mg  a day after level of 14 in 05/2021. Carbamazepine level was 12.3, Levetiracetam level 31.3. He is on Carbatrol 300mg  in AM, 600mg  in PM, Levetiracetam 1500mg  BID, and states he has been taking Zonisamide 400mg  qhs. VNS was placed in 2008, replaced in 2019. His last vEEG monitoring in 03/2005 showed interictal discharges in various regions throughout the right hemisphere as well as run of rhythmic activity in the right central temporal head region, which electrographically was at least suspicious for being electrographic seizure. In 1998, vEEG captured 14 events, 13 had no electrographic correlate and felt to be non-epileptic seizures. One electrographic seizure was seen with onset in the occipital head region and was consistent with a complex partial seizure with secondary generalization. 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   RF: paternal grandmother had blackouts, 12th grade and did some college; special ed classes growing up Epilepsy Risk Factors:  His paternal grandmother had blackouts but was not diagnosed with seizures. He was in special education classes, finished 12th grade and did some college. There is no history of febrile convulsions, CNS infections such as meningitis/encephalitis, significant traumatic brain injury, neurosurgical procedures.  Prior ASMs: Depakote, Diamox, Neurontin, Topamax, Dilantin, and Lamictal.   PAST MEDICAL HISTORY: Past Medical History:  Diagnosis Date   Hypertension    Seizures (HCC)  PAST SURGICAL HISTORY: History reviewed. No pertinent surgical history.  MEDICATIONS: Current  Outpatient Medications on File Prior to Visit  Medication Sig Dispense Refill   acetaminophen (TYLENOL) 500 MG tablet Take 1 tablet (500 mg total) by mouth every 6 (six) hours as needed for mild pain or moderate pain. 30 tablet 0   amLODipine (NORVASC) 5 MG tablet Take 5 mg by mouth daily.     bacitracin ointment Apply 1 application topically 2 (two) times daily. 28 g 1   carbamazepine (TEGRETOL-XR) 200 MG 12 hr tablet Take 400 mg by mouth in the morning and 600 mg at night     levETIRAcetam (KEPPRA) 750 MG tablet Take 1,500 mg by mouth 2 (two) times daily.     multivitamin (ONE-A-DAY MEN'S) TABS tablet Take 1 tablet by mouth every morning.     psyllium (METAMUCIL SMOOTH TEXTURE) 58.6 % powder Take 1 packet by mouth 3 (three) times daily. 283 g 12   zonisamide (ZONEGRAN) 100 MG capsule Take 400 mg by mouth at bedtime.     No current facility-administered medications on file prior to visit.    ALLERGIES: No Known Allergies  FAMILY HISTORY: Family History  Problem Relation Age of Onset   Colon cancer Neg Hx    Colon polyps Neg Hx    Esophageal cancer Neg Hx    Rectal cancer Neg Hx    Stomach cancer Neg Hx     SOCIAL HISTORY: Social History   Socioeconomic History   Marital status: Single    Spouse name: Not on file   Number of children: Not on file   Years of education: Not on file   Highest education level: Not on file  Occupational History   Not on file  Tobacco Use   Smoking status: Never   Smokeless tobacco: Never  Vaping Use   Vaping Use: Never used  Substance and Sexual Activity   Alcohol use: No   Drug use: Not Currently   Sexual activity: Not on file  Other Topics Concern   Not on file  Social History Narrative   Are you right handed or left handed? Right    Are you currently employed ? 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 yes   Who lives with you? NA   What type of home do you live in: 1 story or 2 story?  1 story        Social Determinants of Corporate investment banker Strain: Not on file  Food Insecurity: Not on file  Transportation Needs: Not on file  Physical Activity: Not on file  Stress: Not on file  Social Connections: Not on file  Intimate Partner Violence: Not on file     PHYSICAL EXAM: Vitals:   05/18/22 1039  BP: 129/83  Pulse: 72  SpO2: 97%   General: No acute distress Head:  Normocephalic/atraumatic Skin/Extremities: No rash, no edema Neurological Exam: Mental status: alert and oriented to person, place, and time, no dysarthria or aphasia, Fund of knowledge is appropriate.  Recent and remote memory are intact, 2/3 delayed recall.  Attention and concentration are normal, 3/5 WORLD backwards ("DLORW"). Cranial nerves: CN I: not tested CN II: pupils equal, round, visual fields intact CN III, IV, VI:  full range of motion, no nystagmus, no ptosis CN V: facial sensation intact CN VII: upper and lower face symmetric CN VIII: hearing intact to conversation Bulk & Tone: normal,  no fasciculations. Motor: 5/5 throughout with no pronator drift. Sensation: intact to light touch, cold, pin, vibration sense.  No extinction to double simultaneous stimulation.  Romberg test negative Deep Tendon Reflexes: +2 throughout except +1 right UE Cerebellar: no incoordination on finger to nose testing Gait: narrow-based and steady, able to tandem walk adequately. Tremor: none  VNS Therapy Management: Unit Information Implant Date: 07/05/17 Serial Number: [id number] Generator Number: [id number] (AspireSR M106) Parameters Output Current (mA): 0.25 Signal Frequency (Hz): 30 Pulse Width (usec): 500 Signal ON Time (sec): 30 Signal OFF Time (min): 3 Magnet Output Current (mA): 0.5 Magnet ON Time (sec): 60 Magnet Pulse Width (usec): 250 AutoStim Output Current (mA): na AutoStim Pulse Width (usec): na AutoStim ON Time (sec): na Tachycardia Detection : Off   IMPRESSION: This is a 47 year  old right-handed man with a history of hypertension, co-existing intractable epilepsy s/p VNS implantation and non-epileptic events, presenting to establish care. Prior EEG reported interictal discharges in various regions throughout the right hemisphere as well as run of rhythmic activity in the right central temporal head region. He continues to report recurrent seizures, last was a couple of weeks ago. A 1-hour EEG will be ordered. Increase Zonisamide as previously recommended to 500mg  qhs. Continue Carbamazepine ER 300mg  1 cap in AM, 2 caps in PM and Levetiracetam 750mg  2 tabs BID, refills sent. He was advised to keep a calendar of his seizures. VNS interrogated today, no changes made. He was advised how to use VNS magnet. [place] driving laws were discussed with the patient, and he knows to stop driving after a seizure, until 6 months seizure-free. Follow-up in 3 months, call for any changes.    Thank you for allowing me to participate in the care of this patient. Please do not hesitate to call for any questions or concerns.   Patrcia Dolly, M.D.  CC: Dr. Everlene Other

## 2022-05-25 ENCOUNTER — Ambulatory Visit (AMBULATORY_SURGERY_CENTER): Payer: Medicare HMO | Admitting: Gastroenterology

## 2022-05-25 ENCOUNTER — Encounter: Payer: Self-pay | Admitting: Gastroenterology

## 2022-05-25 VITALS — BP 141/97 | HR 61 | Temp 97.3°F | Resp 14 | Ht 69.0 in | Wt 135.0 lb

## 2022-05-25 DIAGNOSIS — Z1211 Encounter for screening for malignant neoplasm of colon: Secondary | ICD-10-CM | POA: Diagnosis not present

## 2022-05-25 DIAGNOSIS — D122 Benign neoplasm of ascending colon: Secondary | ICD-10-CM

## 2022-05-25 DIAGNOSIS — D125 Benign neoplasm of sigmoid colon: Secondary | ICD-10-CM

## 2022-05-25 MED ORDER — SODIUM CHLORIDE 0.9 % IV SOLN
500.0000 mL | Freq: Once | INTRAVENOUS | Status: DC
Start: 2022-05-25 — End: 2022-05-25

## 2022-05-25 NOTE — Patient Instructions (Signed)
Resume previous diet Continue present medications Await pathology results Handouts/information given for polyps and hemorrhoids  YOU HAD AN ENDOSCOPIC PROCEDURE TODAY AT THE [facility]:   Refer to the procedure report that was given to you for any specific questions about what was found during the examination.  If the procedure report does not answer your questions, please call your gastroenterologist to clarify.  If you requested that your care partner not be given the details of your procedure findings, then the procedure report has been included in a sealed envelope for you to review at your convenience later.  YOU SHOULD EXPECT: Some feelings of bloating in the abdomen. Passage of more gas than usual.  Walking can help get rid of the air that was put into your GI tract during the procedure and reduce the bloating. If you had a lower endoscopy (such as a colonoscopy or flexible sigmoidoscopy) you may notice spotting of blood in your stool or on the toilet paper. If you underwent a bowel prep for your procedure, you may not have a normal bowel movement for a few days.  Please Note:  You might notice some irritation and congestion in your nose or some drainage.  This is from the oxygen used during your procedure.  There is no need for concern and it should clear up in a day or so.  SYMPTOMS TO REPORT IMMEDIATELY:  Following lower endoscopy (colonoscopy):  Excessive amounts of blood in the stool  Significant tenderness or worsening of abdominal pains  Swelling of the abdomen that is new, acute  Fever of 100F or higher  For urgent or emergent issues, a gastroenterologist can be reached at any hour by calling [phone]. Do not use MyChart messaging for urgent concerns.   DIET:  We do recommend a small meal at first, but then you may proceed to your regular diet.  Drink plenty of fluids but you should avoid alcoholic beverages for 24 hours.  ACTIVITY:  You should plan to take  it easy for the rest of today and you should NOT DRIVE or use heavy machinery until tomorrow (because of the sedation medicines used during the test).    FOLLOW UP: Our staff will call the number listed on your records the next business day following your procedure.  We will call around 7:15- 8:00 am to check on you and address any questions or concerns that you may have regarding the information given to you following your procedure. If we do not reach you, we will leave a message.     If any biopsies were taken you will be contacted by phone or by letter within the next 1-3 weeks.  Please call us at [phone] if you have not heard about the biopsies in 3 weeks.    SIGNATURES/CONFIDENTIALITY: You and/or your care partner have signed paperwork which will be entered into your electronic medical record.  These signatures attest to the fact that that the information above on your After Visit Summary has been reviewed and is understood.  Full responsibility of the confidentiality of this discharge information lies with you and/or your care-partner. 

## 2022-05-25 NOTE — Op Note (Signed)
[facility] Patient Name: Daniel Williams Procedure Date: 05/25/2022 11:12 AM MRN: [id number] Endoscopist: Viviann Spare P. Adela Lank , MD, [id number] Age: 47 Referring MD:  Date of Birth: [birth year] Gender: Male Account #: [id number] Procedure:                Colonoscopy Indications:              Screening for colorectal malignant neoplasm, This                            is the patient's first colonoscopy Medicines:                Monitored Anesthesia Care Procedure:                Pre-Anesthesia Assessment:                           - Prior to the procedure, a History and Physical                            was performed, and patient medications and                            allergies were reviewed. The patient's tolerance of                            previous anesthesia was also reviewed. The risks                            and benefits of the procedure and the sedation                            options and risks were discussed with the patient.                            All questions were answered, and informed consent                            was obtained. Prior Anticoagulants: The patient has                            taken no anticoagulant or antiplatelet agents. ASA                            Grade Assessment: III - A patient with severe                            systemic disease. After reviewing the risks and                            benefits, the patient was deemed in satisfactory                            condition to undergo the procedure.  After obtaining informed consent, the colonoscope                            was passed under direct vision. Throughout the                            procedure, the patient's blood pressure, pulse, and                            oxygen saturations were monitored continuously. The                            [id number]-HQ190L Colonoscope [id number] was introduced                            through the anus  and advanced to the the cecum,                            identified by appendiceal orifice and ileocecal                            valve. The colonoscopy was performed without                            difficulty. The patient tolerated the procedure                            well. The quality of the bowel preparation was                            good. The ileocecal valve, appendiceal orifice, and                            rectum were photographed. Scope In: 11:16:53 AM Scope Out: 11:34:22 AM Scope Withdrawal Time: 0 hours 14 minutes 6 seconds  Total Procedure Duration: 0 hours 17 minutes 29 seconds  Findings:                 The perianal and digital rectal examinations were                            normal.                           Three sessile polyps were found in the ascending                            colon. The polyps were 3 mm in size. These polyps                            were removed with a cold snare. Resection and                            retrieval were complete.  A 4 mm polyp was found in the sigmoid colon. The                            polyp was sessile. The polyp was removed with a                            cold snare. Resection and retrieval were complete.                           Internal hemorrhoids were found during retroflexion.                           The exam was otherwise without abnormality. Complications:            No immediate complications. Estimated blood loss:                            Minimal. Estimated Blood Loss:     Estimated blood loss was minimal. Impression:               - Three 3 mm polyps in the ascending colon, removed                            with a cold snare. Resected and retrieved.                           - One 4 mm polyp in the sigmoid colon, removed with                            a cold snare. Resected and retrieved.                           - Internal hemorrhoids.                           -  The examination was otherwise normal.                           - The GI Genius (intelligent endoscopy module),                            computer-aided polyp detection system powered by AI                            was utilized to detect colorectal polyps through                            enhanced visualization during colonoscopy. Recommendation:           - Patient has a contact number available for                            emergencies. The signs and symptoms of potential  delayed complications were discussed with the                            patient. Return to normal activities tomorrow.                            Written discharge instructions were provided to the                            patient.                           - Resume previous diet.                           - Continue present medications.                           - Await pathology results. Viviann Spare P. Deeandra Jerry, MD 05/25/2022 11:38:33 AM This report has been signed electronically.

## 2022-05-25 NOTE — Progress Notes (Signed)
Called to room to assist during endoscopic procedure.  Patient ID and intended procedure confirmed with present staff. Received instructions for my participation in the procedure from the performing physician.  

## 2022-05-25 NOTE — Progress Notes (Signed)
Vss nad trans to pacu 

## 2022-05-25 NOTE — Progress Notes (Signed)
Pt's states no medical or surgical changes since previsit or office visit. 

## 2022-05-25 NOTE — Progress Notes (Signed)
[facility] History and Physical   Primary Care Physician:  Tracey Harries, MD   Reason for Procedure:   Colon cancer screening  Plan:    colonoscopy     HPI: Daniel Williams is a 47 y.o. male  here for colonoscopy screening - first time exam.   Patient denies any bowel symptoms at this time. No family history of colon cancer known. Otherwise feels well without any cardiopulmonary symptoms. History of seizure disorder, has had medications monitored per Neurology, changes recently states he has been doing well on his dose adjustment.   I have discussed risks / benefits of anesthesia and endoscopic procedure with Erline Levine and they wish to proceed with the exams as outlined today.    Past Medical History:  Diagnosis Date   Hypertension    Seizures (HCC)     Past Surgical History:  Procedure Laterality Date   vns stimulator      Prior to Admission medications   Medication Sig Start Date End Date Taking? Authorizing Provider  amLODipine (NORVASC) 5 MG tablet Take 5 mg by mouth daily.   Yes [provider]  carbamazepine (CARBATROL) 300 MG 12 hr capsule Take 1 capsule in morning, 2 capsules in evening 05/18/22  Yes Van Clines, MD  levETIRAcetam (KEPPRA) 750 MG tablet Take 2 tablets twice a day 05/18/22  Yes Van Clines, MD  multivitamin (ONE-A-DAY MEN'S) TABS tablet Take 1 tablet by mouth every morning.   Yes [provider]  zonisamide (ZONEGRAN) 100 MG capsule Take 5 capsules every night 05/18/22  Yes Van Clines, MD  acetaminophen (TYLENOL) 500 MG tablet Take 1 tablet (500 mg total) by mouth every 6 (six) hours as needed for mild pain or moderate pain. 07/22/15   Everlene Farrier, [name]  bacitracin ointment Apply 1 application topically 2 (two) times daily. Patient not taking: Reported on 05/25/2022 07/22/15   Everlene Farrier, [name]  psyllium (METAMUCIL SMOOTH TEXTURE) 58.6 % powder Take 1 packet by mouth 3 (three) times daily. 03/23/18    Eyvonne Mechanic, [name]    Current Outpatient Medications  Medication Sig Dispense Refill   amLODipine (NORVASC) 5 MG tablet Take 5 mg by mouth daily.     carbamazepine (CARBATROL) 300 MG 12 hr capsule Take 1 capsule in morning, 2 capsules in evening 90 capsule 11   levETIRAcetam (KEPPRA) 750 MG tablet Take 2 tablets twice a day 120 tablet 11   multivitamin (ONE-A-DAY MEN'S) TABS tablet Take 1 tablet by mouth every morning.     zonisamide (ZONEGRAN) 100 MG capsule Take 5 capsules every night 150 capsule 11   acetaminophen (TYLENOL) 500 MG tablet Take 1 tablet (500 mg total) by mouth every 6 (six) hours as needed for mild pain or moderate pain. 30 tablet 0   bacitracin ointment Apply 1 application topically 2 (two) times daily. (Patient not taking: Reported on 05/25/2022) 28 g 1   psyllium (METAMUCIL SMOOTH TEXTURE) 58.6 % powder Take 1 packet by mouth 3 (three) times daily. 283 g 12   Current Facility-Administered Medications  Medication Dose Route Frequency Provider Last Rate Last Admin   0.9 %  sodium chloride infusion  500 mL Intravenous Once Jenna Ardoin, Willaim Rayas, MD        Allergies as of 05/25/2022   (No Known Allergies)    Family History  Problem Relation Age of Onset   Colon cancer Neg Hx    Colon polyps Neg Hx    Esophageal cancer Neg Hx  Rectal cancer Neg Hx    Stomach cancer Neg Hx     Social History   Socioeconomic History   Marital status: Single    Spouse name: Not on file   Number of children: Not on file   Years of education: Not on file   Highest education level: Not on file  Occupational History   Not on file  Tobacco Use   Smoking status: Never   Smokeless tobacco: Never  Vaping Use   Vaping Use: Never used  Substance and Sexual Activity   Alcohol use: No   Drug use: Not Currently   Sexual activity: Not on file  Other Topics Concern   Not on file  Social History Narrative   Are you right handed or left handed? Right    Are you currently employed  ? 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 yes   Who lives with you? NA   What type of home do you live in: 1 story or 2 story? 1 story        Social Determinants of Corporate investment banker Strain: Not on file  Food Insecurity: Not on file  Transportation Needs: Not on file  Physical Activity: Not on file  Stress: Not on file  Social Connections: Not on file  Intimate Partner Violence: Not on file    Review of Systems: All other review of systems negative except as mentioned in the HPI.  Physical Exam: Vital signs BP 112/72   Pulse 73   Temp (!) 97.3 F (36.3 C) (Temporal)   Ht 5\' 9"  (1.753 m)   Wt 135 lb (61.2 kg)   SpO2 100%   BMI 19.94 kg/m   General:   Alert,  Well-developed, pleasant and cooperative in NAD Lungs:  Clear throughout to auscultation.   Heart:  Regular rate and rhythm Abdomen:  Soft, nontender and nondistended.   Neuro/Psych:  Alert and cooperative. Normal mood and affect. A and O x 3  Harlin Rain, MD [facility]

## 2022-05-26 ENCOUNTER — Telehealth: Payer: Self-pay

## 2022-05-26 NOTE — Telephone Encounter (Signed)
  Follow up Call-     05/25/2022   10:43 AM  Call back number  Post procedure Call Back phone  # [phone]  Permission to leave phone message Yes     Patient questions:  Do you have a fever, pain , or abdominal swelling? No. Pain Score  0 *  Have you tolerated food without any problems? Yes.    Have you been able to return to your normal activities? Yes.    Do you have any questions about your discharge instructions: Diet   No. Medications  No. Follow up visit  No.  Do you have questions or concerns about your Care? No.  Actions: * If pain score is 4 or above: No action needed, pain <4.

## 2022-05-29 ENCOUNTER — Ambulatory Visit (INDEPENDENT_AMBULATORY_CARE_PROVIDER_SITE_OTHER): Payer: Medicare HMO | Admitting: Neurology

## 2022-05-29 DIAGNOSIS — G40219 Localization-related (focal) (partial) symptomatic epilepsy and epileptic syndromes with complex partial seizures, intractable, without status epilepticus: Secondary | ICD-10-CM | POA: Diagnosis not present

## 2022-05-29 NOTE — Progress Notes (Signed)
EEG complete - results pending 

## 2022-06-04 ENCOUNTER — Encounter: Payer: Self-pay | Admitting: Gastroenterology

## 2022-06-09 ENCOUNTER — Telehealth: Payer: Self-pay | Admitting: Neurology

## 2022-06-09 NOTE — Telephone Encounter (Signed)
Pt called in and left a message. He is wanting to speak with someone about the heat and if it will make his condition worse?

## 2022-06-13 NOTE — Procedures (Signed)
ELECTROENCEPHALOGRAM REPORT  Date of Study: 05/29/2022  Patient's Name: Daniel Williams MRN: [id number] Date of Birth: Oct 14, 1975  Referring Provider: Dr. Patrcia Dolly  Clinical History: This is a 47 year old man with intractable epilepsy with continued seizures on 3 ASMs. EEG for classification.  Medications: Carbatrol, Keppra, Zonisamide  Technical Summary: A multichannel digital 1-hour EEG recording measured by the international 10-20 system with electrodes applied with paste and impedances below 5000 ohms performed in our laboratory with EKG monitoring in an awake and drowsy patient.  Hyperventilation and photic stimulation were performed.  The digital EEG was referentially recorded, reformatted, and digitally filtered in a variety of bipolar and referential montages for optimal display.    Description: The patient is awake and drowsy during the recording.  During maximal wakefulness, there is a symmetric, medium voltage 9.5 Hz posterior dominant rhythm that attenuates with eye opening.  There is occasional focal 4-5 Hz theta slowing over the right temporal region.  During drowsiness , there is an increase in theta slowing of the background.  Sleep was not captured. Hyperventilation and photic stimulation did not elicit any abnormalities.  There were frequent right frontotemporal sharp waves seen. No electrographic seizures seen.    Technical difficulties with EKG lead.  Impression: This 1-hour awake and drowsy EEG is abnormal due to the presence of: Occasional focal slowing over the right temporal region. Frequent right frontotemporal epileptiform discharges  Clinical Correlation of the above findings indicates focal cerebral dysfunction over the right temporal region suggestive of underlying structural or physiologic abnormality. There is a tendency for seizures to arise from the right frontotemporal region. Clinical correlation is advised.    Patrcia Dolly, M.D.

## 2022-06-14 NOTE — Telephone Encounter (Signed)
Pls let him know that everyone is different, it would not necessarily worsen seizures, but some patients have noticed it can. He told me on his visit with me that heat can worsen it. Pls also let him know the EEG showed similar changes to his prior EEG, how is he feeling on the increased dose of Zonisamide 5 caps qhs? Pt stated that he has only been taken 3 caps at night, pt advised that he needs to take 4 caps for a week and then increase to 5 capsules at night and stay at that dose, Dr Karel Jarvis aware,

## 2022-06-14 NOTE — Telephone Encounter (Signed)
Pt called no answer no voice mail answered

## 2022-06-14 NOTE — Telephone Encounter (Signed)
F/u    RTN call back to the nurse

## 2022-06-14 NOTE — Telephone Encounter (Signed)
Pls let him know that everyone is different, it would not necessarily worsen seizures, but some patients have noticed it can. He told me on his visit with me that heat can worsen it. Pls also let him know the EEG showed similar changes to his prior EEG, how is he feeling on the increased dose of Zonisamide 5 caps qhs? Thanks

## 2022-08-30 ENCOUNTER — Ambulatory Visit (INDEPENDENT_AMBULATORY_CARE_PROVIDER_SITE_OTHER): Payer: Medicare HMO | Admitting: Neurology

## 2022-08-30 ENCOUNTER — Encounter: Payer: Self-pay | Admitting: Neurology

## 2022-08-30 ENCOUNTER — Other Ambulatory Visit: Payer: Self-pay

## 2022-08-30 VITALS — BP 132/89 | HR 78 | Ht 69.0 in | Wt 128.6 lb

## 2022-08-30 DIAGNOSIS — G40219 Localization-related (focal) (partial) symptomatic epilepsy and epileptic syndromes with complex partial seizures, intractable, without status epilepticus: Secondary | ICD-10-CM

## 2022-08-30 MED ORDER — XCOPRI 50 MG PO TABS: ORAL_TABLET | ORAL | 5 refills | Status: DC

## 2022-08-30 NOTE — Patient Instructions (Addendum)
Good to see you.  Schedule MRI brain with and without contrast. Please schedule any day except Thursday. You will need to have the VNS turned off before the MRI, then come back to turn back on after MRI  2. Start Xcopri starter pack: take 12.5mg  tablet every night for 2 weeks, then take 25mg  tablet every night for 2 weeks. After finishing starter pack, take 50mg  tablet every night  3. Continue all other medications:  Take Levetiracetam 750mg : Take 2 tablets in AM, 2 tablets in PM Take Zonisamide 100mg : take 5 capsules every night Take Carbamazepine ER 300mg : take 1 capsule in AM, 2 capsules in PM  4. Start keeping a calendar of your seizures  5. Follow-up in 3 months, call for any changes   Seizure Precautions: 1. If medication has been prescribed for you to prevent seizures, take it exactly as directed.  Do not stop taking the medicine without talking to your doctor first, even if you have not had a seizure in a long time.   2. Avoid activities in which a seizure would cause danger to yourself or to others.  Don't operate dangerous machinery, swim alone, or climb in high or dangerous places, such as on ladders, roofs, or girders.  Do not drive unless your doctor says you may.  3. If you have any warning that you may have a seizure, lay down in a safe place where you can't hurt yourself.    4.  No driving for 6 months from last seizure, as per [facility].   Please refer to the following link on the [employer]'s website for more information: http://www.epilepsyfoundation.org/answerplace/Social/driving/drivingu.cfm   5.  Maintain good sleep hygiene. Avoid alcohol.  6.  Contact your doctor if you have any problems that may be related to the medicine you are taking.  7.  Call 911 and bring the patient back to the ED if:        A.  The seizure lasts longer than 5 minutes.       B.  The patient doesn't awaken shortly after the seizure  C.  The patient has  new problems such as difficulty seeing, speaking or moving  D.  The patient was injured during the seizure  E.  The patient has a temperature over 102 F (39C)  F.  The patient vomited and now is having trouble breathing

## 2022-08-30 NOTE — Progress Notes (Signed)
NEUROLOGY FOLLOW UP OFFICE NOTE  Daniel Williams [id number] [birth year]  HISTORY OF PRESENT ILLNESS: I had the pleasure of seeing Daniel Williams in follow-up in the neurology clinic on 08/30/2022.  The patient was last seen 46 months ago for co-existing intractable epilepsy s/p VNS and non-epileptic events. He is accompanied by his mother who helps supplement the history today. Records and images were personally reviewed where available.  His 1-hour EEG in 05/2022 showed occasional slowing on the right temporal region, frequent right frontotemporal epileptiform discharges. On his last visit, he reported continued seizures on 3 ASMs, he was instructed to increase Zonisamide to 500mg  at bedtime, however when called about EEG results in May, reported he was taking 300mg  at bedtime. He was given instructions for uptitration, he is currently taking 500mg  qhs. He is also on Carbamazepine ER 300mg  1 cap in AM, 2 caps in PM and Levetiracetam 750mg  2 tabs BID. No side effects on medications. He reports 10 seizures in the past 3 months, last he recalls was 2-3 weeks ago. He lives alone and was folding clothes, he did not know if he fell off the bed but he had hit his head and woke up with blood on the side of the floor. No tongue bite or incontinence. He and his mother started to have a loud discussion about his mood issues, he states he tries to stay away from depression because he knows it can trigger seizures. He was also again reporting how heat can be a trigger.     History On Initial Assessment 05/18/2022: This is a 47 year old right-handed man with a history of hypertension, co-existing intractable epilepsy s/p VNS implantation and non-epileptic events, presenting to establish care. Records were reviewed. He reports seizures started in childhood. Sometimes he gets a feeling and knows he needs to sit down, or his eyes start messing up, then he wakes up with injuries. He always bites inside of his gums on the  left, last occurrence was a couple of weeks ago. He reports waking up with injuries on his back, left ear a couple of weeks ago. He lives alone. No focal weakness, he feels drained and tired after. He has difficulty determining seizure frequency but states he does not have them like before, he thinks he had 4 seizures in the last 2 months. Seizure triggers include stress, getting worked up, heat. He denies any olfactory/gustatory hallucinations, deja vu, rising epigastric sensation, focal numbness/tingling/weakness, myoclonic jerks. He has migraines once in a while with good response to Tylenol or Ibuprofen. No nausea/vomiting, photo/phonophobia. There is a strong family history of migraines in his mother, maternal grandfather, and paternal grandmother). No dizziness, diplopia, dysarthria/dysphagia, neck pain. He has some back pain and occasional constipation. He gets around 12 hours of sleep. He lives alone. 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   Records from [facility] were reviewed, last visit with epileptologist Dr. Craig Staggers was in 03/2022. Per records, Zonisamide was increased to 500mg  a day after level of 14 in 05/2021. Carbamazepine level was 12.3, Levetiracetam level 31.3. He is on Carbatrol 300mg  in AM, 600mg  in PM, Levetiracetam 1500mg  BID, and states he has been taking Zonisamide 400mg  qhs. VNS was placed in 2008, replaced in 2019. His last vEEG monitoring in 03/2005 showed interictal discharges in various regions throughout the right hemisphere as well as run of rhythmic activity in the right central temporal head region, which electrographically was  at least suspicious for being electrographic seizure. In 1998, vEEG captured 14 events, 13 had no electrographic correlate and felt to be non-epileptic seizures. One electrographic seizure was seen with onset in the occipital head region and was consistent with a complex partial seizure with secondary  generalization. 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   RF: paternal grandmother had blackouts, 12th grade and did some college; special ed classes growing up Epilepsy Risk Factors:  His paternal grandmother had blackouts but was not diagnosed with seizures. He was in special education classes, finished 12th grade and did some college. There is no history of febrile convulsions, CNS infections such as meningitis/encephalitis, significant traumatic brain injury, neurosurgical procedures.  Prior ASMs: Depakote, Diamox, Neurontin, Topamax, Dilantin, and Lamictal.  PAST MEDICAL HISTORY: Past Medical History:  Diagnosis Date   Hypertension    Seizures (HCC)     MEDICATIONS: Current Outpatient Medications on File Prior to Visit  Medication Sig Dispense Refill   acetaminophen (TYLENOL) 500 MG tablet Take 1 tablet (500 mg total) by mouth every 6 (six) hours as needed for mild pain or moderate pain. 30 tablet 0   amLODipine (NORVASC) 5 MG tablet Take 5 mg by mouth daily.     carbamazepine (CARBATROL) 300 MG 12 hr capsule Take 1 capsule in morning, 2 capsules in evening 90 capsule 11   levETIRAcetam (KEPPRA) 750 MG tablet Take 2 tablets twice a day 120 tablet 11   multivitamin (ONE-A-DAY MEN'S) TABS tablet Take 1 tablet by mouth every morning.     psyllium (METAMUCIL SMOOTH TEXTURE) 58.6 % powder Take 1 packet by mouth 3 (three) times daily. 283 g 12   zonisamide (ZONEGRAN) 100 MG capsule Take 5 capsules every night 150 capsule 11   No current facility-administered medications on file prior to visit.    ALLERGIES: No Known Allergies  FAMILY HISTORY: Family History  Problem Relation Age of Onset   Colon cancer Neg Hx    Colon polyps Neg Hx    Esophageal cancer Neg Hx    Rectal cancer Neg Hx    Stomach cancer Neg Hx      SOCIAL HISTORY: Social History   Socioeconomic History   Marital status: Single    Spouse name: Not on file   Number of children: Not on file   Years of education: Not on file   Highest education level: Not on file  Occupational History   Not on file  Tobacco Use   Smoking status: Never   Smokeless tobacco: Never  Vaping Use   Vaping status: Never Used  Substance and Sexual Activity   Alcohol use: No   Drug use: Not Currently   Sexual activity: Not on file  Other Topics Concern   Not on file  Social History Narrative   Are you right handed or left handed? Right    Are you currently employed ? 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 yes   Who lives with you? NA   What type of home do you live in: 1 story or 2 story? 1 story        Social Determinants of Health   Financial Resource Strain: Low Risk  (04/12/2022)   Received from [facility], [facility]   Overall Financial Resource Strain (CARDIA)  Difficulty of Paying Living Expenses: Not hard at all  Food Insecurity: No Food Insecurity (04/12/2022)   Received from [facility], [facility]   Hunger Vital Sign    Worried About Running Out of Food in the Last Year: Never true    Ran Out of Food in the Last Year: Never true  Transportation Needs: No Transportation Needs (04/12/2022)   Received from [facility], [facility]   [facility] - Transportation    Lack of Transportation (Medical): No    Lack of Transportation (Non-Medical): No  Physical Activity: Sufficiently Active (10/12/2021)   Received from [facility], [facility]   Exercise Vital Sign    Days of Exercise per Week: 5 days    Minutes of Exercise per Session: 30 min  Stress: No Stress Concern Present (10/12/2021)   Received from [facility], [facility] - Occupational Stress Questionnaire    Feeling of Stress : Not at all  Social Connections:  Unknown (04/12/2022)   Received from [facility], [facility]   Social Network    Social Network: Not on file  Intimate Partner Violence: Unknown (04/12/2022)   Received from [facility], [facility]   HITS    Physically Hurt: Not on file    Insult or Talk Down To: Not on file    Threaten Physical Harm: Not on file    Scream or Curse: Not on file     PHYSICAL EXAM: Vitals:   08/30/22 1255  BP: 132/89  Pulse: 78  SpO2: 100%   General: No acute distress Head:  Normocephalic/atraumatic Skin/Extremities: No rash, no edema Neurological Exam: alert and awake. No aphasia or dysarthria. Fund of knowledge is appropriate.   Attention and concentration are reduced, needs repeated instructions. Cranial nerves: Pupils equal, round. Extraocular movements intact.  No facial asymmetry.  Motor: moves all extremities symmetrically. Gait narrow-based and steady, no ataxia.    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 Prior EEG reported interictal discharges in various regions throughout the right hemisphere as well as run of rhythmic activity in the right central temporal head region, most recent EEG showed frequent right frontotemporal epileptiform discharges. He continues to report frequent seizures (10 in the past 3 months). We discussed options, including presurgical evaluation for epilepsy surgery. He is hesitant and would like to try adding on another ASM for now. Side effects of being on multiple medications, as well as Xcopri side effects discussed. Starter pack provided, start 12.5mg  at bedtime for 2 weeks, then 25mg  at bedtime for 2 weeks, then take 50mg  at bedtime. Continue Zonisamide 500mg  at bedtime, carbamazepine ER 300mg  1 cap in AM, 2 caps in PM and Levetiracetam 750mg  2 tabs BID. MRI brain with and without contrast will be ordered as part of seizure workup. He does not drive. He  was advised to start a seizure calendar, follow-up in 3 months, call for any changes.    Thank you for allowing me to participate in his care.  Please do not hesitate to call for any questions or concerns.    Patrcia Dolly, M.D.   CC: Dr. Everlene Other

## 2022-09-01 ENCOUNTER — Telehealth: Payer: Self-pay | Admitting: Neurology

## 2022-09-01 NOTE — Telephone Encounter (Signed)
Pt is calling in stating that the radiologist called him on yesterday needing him to give them the model number of the vagus Nerve Stimulator that he had replaced in 2020 or 2021.  Pt would like to have a call back.

## 2022-09-01 NOTE — Telephone Encounter (Signed)
Called [facility] and gave information

## 2022-09-01 NOTE — Telephone Encounter (Signed)
[facility] says we need to find the serial numbers and model number before they will perform the MRI

## 2022-09-01 NOTE — Telephone Encounter (Signed)
Serial Number: [id number] Generator Number: [id number] ([id number] [id number])

## 2022-12-06 ENCOUNTER — Telehealth: Payer: Self-pay

## 2022-12-06 ENCOUNTER — Encounter: Payer: Self-pay | Admitting: Neurology

## 2022-12-06 ENCOUNTER — Ambulatory Visit (INDEPENDENT_AMBULATORY_CARE_PROVIDER_SITE_OTHER): Payer: Medicare HMO | Admitting: Neurology

## 2022-12-06 VITALS — BP 132/86 | HR 70 | Ht 69.0 in | Wt 129.0 lb

## 2022-12-06 DIAGNOSIS — G40219 Localization-related (focal) (partial) symptomatic epilepsy and epileptic syndromes with complex partial seizures, intractable, without status epilepticus: Secondary | ICD-10-CM | POA: Diagnosis not present

## 2022-12-06 MED ORDER — LEVETIRACETAM 750 MG PO TABS: ORAL_TABLET | ORAL | 11 refills | Status: DC

## 2022-12-06 MED ORDER — XCOPRI 50 MG PO TABS: ORAL_TABLET | ORAL | 5 refills | Status: DC

## 2022-12-06 MED ORDER — CARBAMAZEPINE ER 300 MG PO CP12: ORAL_CAPSULE | ORAL | 11 refills | Status: DC

## 2022-12-06 MED ORDER — ZONISAMIDE 100 MG PO CAPS: ORAL_CAPSULE | ORAL | 11 refills | Status: DC

## 2022-12-06 NOTE — Progress Notes (Signed)
NEUROLOGY FOLLOW UP OFFICE NOTE  Daniel Williams [id number] [birth year]  HISTORY OF PRESENT ILLNESS: I had the pleasure of seeing Daniel Williams in follow-up in the neurology clinic on 12/06/2022.  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 He is alone in the office today. Records and images were personally reviewed where available. His 1-hour EEG in 05/2022 showed occasional slowing on the right temporal region, frequent right frontotemporal epileptiform discharges. He has not yet had brain MRI done, he states that he was not called for scheduling. On his last visit, he continued to report 10 seizures in a 3 month period. We discussed options, including presurgical evaluation. He opted for a trial of Xcopri and was given a starter pack and refills for 50mg  at bedtime. He states he got one bottle then no further refills. He is on Zonisamide 500mg  at bedtime, carbamazepine ER 300mg  1 cap in AM, 2 caps in PM and Levetiracetam 750mg  2 tabs BID. He denies having any side effects when he took the [place]. He reports less seizures with addition of Xcopri, he had 5 while taking it, but ran out and had a seizure last week, no injuries. He is noted to get easily worked up and starts rambling about his brain MRI or his activities that he has been doing to keep his brain stimulated. He states with the seizures he can hear and understand but cannot respond.    History On Initial Assessment 05/18/2022: This is a 47 year old right-handed man with a history of hypertension, co-existing intractable epilepsy s/p VNS implantation and non-epileptic events, presenting to establish care. Records were reviewed. He reports seizures started in childhood. Sometimes he gets a feeling and knows he needs to sit down, or his eyes start messing up, then he wakes up with injuries. He always bites inside of his gums on the left, last occurrence was a couple of weeks ago.  He reports waking up with injuries on his back, left ear a couple of weeks ago. He lives alone. No focal weakness, he feels drained and tired after. He has difficulty determining seizure frequency but states he does not have them like before, he thinks he had 4 seizures in the last 2 months. Seizure triggers include stress, getting worked up, heat. He denies any olfactory/gustatory hallucinations, deja vu, rising epigastric sensation, focal numbness/tingling/weakness, myoclonic jerks. He has migraines once in a while with good response to Tylenol or Ibuprofen. No nausea/vomiting, photo/phonophobia. There is a strong family history of migraines in his mother, maternal grandfather, and paternal grandmother). No dizziness, diplopia, dysarthria/dysphagia, neck pain. He has some back pain and occasional constipation. He gets around 12 hours of sleep. He lives alone. 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   Records from [facility] were reviewed, last visit with epileptologist Dr. Craig Staggers was in 03/2022. Per records, Zonisamide was increased to 500mg  a day after level of 14 in 05/2021. Carbamazepine level was 12.3, Levetiracetam level 31.3. He is on Carbatrol 300mg  in AM, 600mg  in PM, Levetiracetam 1500mg  BID, and states he has been taking Zonisamide 400mg  qhs. VNS was placed in 2008, replaced in 2019. His last vEEG monitoring in 03/2005 showed interictal discharges in various regions throughout the right hemisphere as well as run of rhythmic activity in the right central temporal head region, which electrographically was at least suspicious for being electrographic seizure. In 1998,  vEEG captured 14 events, 13 had no electrographic correlate and felt to be non-epileptic seizures. One electrographic seizure was seen with onset in the occipital head region and was consistent with a complex partial seizure with secondary generalization. Head CT in 08/2019 showed torus  palatinus, slightly enlarged right lateral ventricle compared to left, slightly more prominent sulci in the right cerebral hemisphere suggesting mild right cerebral volume loss.   RF: paternal grandmother had blackouts, 12th grade and did some college; special ed classes growing up Epilepsy Risk Factors:  His paternal grandmother had blackouts but was not diagnosed with seizures. He was in special education classes, finished 12th grade and did some college. There is no history of febrile convulsions, CNS infections such as meningitis/encephalitis, significant traumatic brain injury, neurosurgical procedures.  Prior ASMs: Depakote, Diamox, Neurontin, Topamax, Dilantin, and Lamictal.   PAST MEDICAL HISTORY: Past Medical History:  Diagnosis Date   Hypertension    Seizures (HCC)     MEDICATIONS: Current Outpatient Medications on File Prior to Visit  Medication Sig Dispense Refill   acetaminophen (TYLENOL) 500 MG tablet Take 1 tablet (500 mg total) by mouth every 6 (six) hours as needed for mild pain or moderate pain. 30 tablet 0   amLODipine (NORVASC) 5 MG tablet Take 5 mg by mouth daily.     carbamazepine (CARBATROL) 300 MG 12 hr capsule Take 1 capsule in morning, 2 capsules in evening 90 capsule 11   levETIRAcetam (KEPPRA) 750 MG tablet Take 2 tablets twice a day 120 tablet 11   multivitamin (ONE-A-DAY MEN'S) TABS tablet Take 1 tablet by mouth every morning.     psyllium (METAMUCIL SMOOTH TEXTURE) 58.6 % powder Take 1 packet by mouth 3 (three) times daily. 283 g 12   zonisamide (ZONEGRAN) 100 MG capsule Take 5 capsules every night 150 capsule 11   Cenobamate (XCOPRI) 50 MG TABS After finishing sample pack, start Xcopri 50mg : take 1 tablet every night 30 tablet 5   No current facility-administered medications on file prior to visit.    ALLERGIES: No Known Allergies  FAMILY HISTORY: Family History  Problem Relation Age of Onset   Colon cancer Neg Hx    Colon polyps Neg Hx     Esophageal cancer Neg Hx    Rectal cancer Neg Hx    Stomach cancer Neg Hx     SOCIAL HISTORY: Social History   Socioeconomic History   Marital status: Single    Spouse name: Not on file   Number of children: Not on file   Years of education: Not on file   Highest education level: Not on file  Occupational History   Not on file  Tobacco Use   Smoking status: Never   Smokeless tobacco: Never  Vaping Use   Vaping status: Never Used  Substance and Sexual Activity   Alcohol use: No   Drug use: Not Currently   Sexual activity: Not on file  Other Topics Concern   Not on file  Social History Narrative   Are you right handed or left handed? Right    Are you currently employed ? 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 yes   Who lives with you? NA   What type of home do you live in: 1 story or 2 story? 1 story        Social Determinants of Health   Financial Resource Strain: Low Risk  (10/17/2022)   Received  from [facility]   Overall Financial Resource Strain (CARDIA)    Difficulty of Paying Living Expenses: Not hard at all  Food Insecurity: No Food Insecurity (10/17/2022)   Received from [facility]   Hunger Vital Sign    Worried About Running Out of Food in the Last Year: Never true    Ran Out of Food in the Last Year: Never true  Transportation Needs: No Transportation Needs (10/17/2022)   Received from [facility] - Transportation    Lack of Transportation (Medical): No    Lack of Transportation (Non-Medical): No  Physical Activity: Unknown (10/17/2022)   Received from [facility]   Exercise Vital Sign    Days of Exercise per Week: 0 days    Minutes of Exercise per Session: Not on file  Stress: No Stress Concern Present (10/17/2022)   Received from [facility] - Occupational Stress Questionnaire    Feeling of Stress : Not at all  Social Connections: Socially  Integrated (10/17/2022)   Received from [facility]   Social Network    How would you rate your social network (family, work, friends)?: Good participation with social networks  Intimate Partner Violence: Not At Risk (10/17/2022)   Received from [facility]   HITS    Over the last 12 months how often did your partner physically hurt you?: Never    Over the last 12 months how often did your partner insult you or talk down to you?: Never    Over the last 12 months how often did your partner threaten you with physical harm?: Never    Over the last 12 months how often did your partner scream or curse at you?: Never     PHYSICAL EXAM: Vitals:   12/06/22 1247  BP: 132/86  Pulse: 70  SpO2: 99%   General: No acute distress Head:  Normocephalic/atraumatic Skin/Extremities: No rash, no edema Neurological Exam: alert and awake. No aphasia or dysarthria. Fund of knowledge is reduced. Attention and concentration are reduced, rambling and tangential at times, easy to redirect. Cranial nerves: Pupils equal, round. Extraocular movements intact with no nystagmus. Visual fields full.  No facial asymmetry.  Motor: Bulk and tone normal, muscle strength 5/5 throughout with no pronator drift.   Finger to nose testing intact.  Gait narrow-based and steady, able to tandem walk adequately.  Romberg negative.   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 Prior EEG reported interictal discharges in various regions throughout the right hemisphere as well as run of rhythmic activity in the right central temporal head region, most recent EEG showed frequent right frontotemporal epileptiform discharges. Daniel Williams was added in 08/2022 with some improvement however he ran out of refills. Prescription for Xcopri 50mg  at bedtime sent today, as well as refills for Zonisamide 500mg  at bedtime, carbamazepine ER 300mg  1 cap in  AM, 2 caps in PM and Levetiracetam 750mg  2 tabs BID. Proceed with MRI brain with and without contrast as previously discussed. He does not drive. Follow-up in 4 months, call for any changes.   Thank you for allowing me to participate in his care.  Please do not hesitate to call for any questions or concerns.    Patrcia Dolly, M.D.   CC: Dr. Everlene Other

## 2022-12-06 NOTE — Patient Instructions (Signed)
Good to see you.  We will re-send the order for brain MRI with and without contrast so they can call to schedule with you  2. Restart Xcopri 50mg : take 1 tablet every night  3. Continue all your other medications  4. Follow-up in 4 months, call for any changes   Seizure Precautions: 1. If medication has been prescribed for you to prevent seizures, take it exactly as directed.  Do not stop taking the medicine without talking to your doctor first, even if you have not had a seizure in a long time.   2. Avoid activities in which a seizure would cause danger to yourself or to others.  Don't operate dangerous machinery, swim alone, or climb in high or dangerous places, such as on ladders, roofs, or girders.  Do not drive unless your doctor says you may.  3. If you have any warning that you may have a seizure, lay down in a safe place where you can't hurt yourself.    4.  No driving for 6 months from last seizure, as per [facility].   Please refer to the following link on the Epilepsy Foundation of America's website for more information: http://www.epilepsyfoundation.org/answerplace/Social/driving/drivingu.cfm   5.  Maintain good sleep hygiene. Avoid alcohol.  6.  Contact your doctor if you have any problems that may be related to the medicine you are taking.  7.  Call 911 and bring the patient back to the ED if:        A.  The seizure lasts longer than 5 minutes.       B.  The patient doesn't awaken shortly after the seizure  C.  The patient has new problems such as difficulty seeing, speaking or moving  D.  The patient was injured during the seizure  E.  The patient has a temperature over 102 F (39C)  F.  The patient vomited and now is having trouble breathing

## 2022-12-06 NOTE — Telephone Encounter (Signed)
Pt is in the office today he  needs a PA on his xcopri

## 2022-12-08 ENCOUNTER — Telehealth: Payer: Self-pay | Admitting: Neurology

## 2022-12-08 NOTE — Telephone Encounter (Signed)
Pls check on this, we have had several patients with a VNS get imaging. What is the issue?

## 2022-12-08 NOTE — Telephone Encounter (Signed)
Called [place] [facility] they are going to call me back to let me know why they can not scan VNS pt, they girl with scheduling asked me can't we sent them to another location, I have asked for [place] to call me back,

## 2022-12-08 NOTE — Telephone Encounter (Signed)
[place] needs a call back regarding a referral they received. Patient can't be seen cause he has a VNS stimulator.   [phone] ext [phone]

## 2022-12-11 ENCOUNTER — Other Ambulatory Visit: Payer: Self-pay

## 2022-12-11 DIAGNOSIS — G40219 Localization-related (focal) (partial) symptomatic epilepsy and epileptic syndromes with complex partial seizures, intractable, without status epilepticus: Secondary | ICD-10-CM

## 2022-12-11 NOTE — Telephone Encounter (Signed)
Order sent to [facility]

## 2022-12-26 NOTE — Telephone Encounter (Signed)
[facility] called they have the information needed they are making sure its compatible with the machine, pt called an informed he will call back when its scheduled so we know the date so we can turn the VNS off

## 2022-12-26 NOTE — Telephone Encounter (Signed)
Patient said they called him for the MRI but he still can't get it done until they know the VNS model number. So he has called [facility] to get more information about it. Once he gets info about VNS he will call heather back

## 2023-01-03 ENCOUNTER — Telehealth: Payer: Self-pay | Admitting: Pharmacy Technician

## 2023-01-03 ENCOUNTER — Other Ambulatory Visit (HOSPITAL_COMMUNITY): Payer: Self-pay

## 2023-01-03 NOTE — Telephone Encounter (Signed)
PA not needed.

## 2023-01-03 NOTE — Telephone Encounter (Signed)
Pharmacy Patient Advocate Encounter   Received notification from Pt Calls Messages that prior authorization for XCOPRI 50MG  is required/requested.   Insurance verification completed.   The patient is insured through [employer] .   Per test claim: Refill too soon. PA is not needed at this time. Medication was filled 12.13.24. Next eligible fill date is 1.5.25.

## 2023-01-23 ENCOUNTER — Telehealth: Payer: Self-pay | Admitting: Neurology

## 2023-01-23 ENCOUNTER — Encounter: Payer: Self-pay | Admitting: Neurology

## 2023-01-23 NOTE — Telephone Encounter (Signed)
 Pt called to come in the Friday before his MRI to have is VNS turned off and he will come in on the Monday after to have his VNS turned on

## 2023-01-23 NOTE — Telephone Encounter (Signed)
 Pt is scheduled for MRI on Jan 26 that is a Sunday. Will he be ok coming in on a Friday to have his VNS turned off and coming back on Monday to have it turned back on?

## 2023-01-23 NOTE — Telephone Encounter (Signed)
That is fine, thanks 

## 2023-01-23 NOTE — Telephone Encounter (Signed)
Pt called no answer no voice mail set up

## 2023-01-23 NOTE — Telephone Encounter (Signed)
 Patient returning call for [facility]

## 2023-01-23 NOTE — Telephone Encounter (Signed)
 Pt is not going to change his care pt mother just wanted to make sure that we are a certified Neurologist office. They are going to call [facility] to schedule his MRI they will let us know when the appointment is so we can turn his VNS off and on,

## 2023-01-23 NOTE — Telephone Encounter (Signed)
 Patient wants to talk with aquino about some things. He said he wants to switch care. He said we are doing an excellent job but he feels he may need to switch. He doesn't want to hurt anyone's feelings. He is very humbled but he feels he may need to switch so things can be more simple for him. He would like to speak with aquino about this. He doesn't want to have to go to multiple places.

## 2023-01-23 NOTE — Telephone Encounter (Signed)
 Patient called back to speak with heather. Regarding the same information

## 2023-02-05 ENCOUNTER — Telehealth: Payer: Self-pay | Admitting: Neurology

## 2023-02-05 NOTE — Telephone Encounter (Signed)
Patient needs to speak to someone about getting his  VNS  turned off and instruction to for the appt in [place]

## 2023-02-05 NOTE — Telephone Encounter (Signed)
Pt called he will come in on Friday to have his VNS turned off, pt given the number to [place] to get the address to where he needed to got to have his imaging done,

## 2023-02-19 ENCOUNTER — Telehealth: Payer: Self-pay | Admitting: Neurology

## 2023-02-19 NOTE — Telephone Encounter (Signed)
Pt. Mom dropped off [employer] paper work to be filled out. She would like to pick it up once completed. Forms are ion Dr. Rosalyn Gess box.

## 2023-02-19 NOTE — Telephone Encounter (Signed)
Pt mother called she stated that [facility] told them that they could not do his MRI, they do not remember why they can't do it, I will reach out to [place] to see if we can get his MRI done there,   Pt c/o: seizure Missed medications?  Mom said that she is unsure if he missed any medication  has days that he repeats his self mom is not sure if he is taken his medication correct  Sleep deprived?  Yes.   He does not sleep like he should  Alcohol intake?  No. Increased stress? No. Any change in medication color or shape? No. Any trigger? No Back to their usual baseline self?  Yes.  . If no, advise go to ER Current medications prescribed by Dr. Karel Jarvis:   carbamazepine (CARBATROL) 300 MG 12 hr capsule 1 capsule in morning, 2 capsules in evening  Cenobamate (XCOPRI) 50 MG TABS Take 1 tablet every night, Normal  levETIRAcetam (KEPPRA) 750 MG tablet Take 2 tablets twice a day,  zonisamide (ZONEGRAN) 100 MG capsule Take 5 capsules every night   Some days he is off balance, mom said that she can tell in his eye when he has had a spell. Pt is resting ,  Mom wants to know where the seizures are coming from, she said she wants to come to the next appointment,

## 2023-02-19 NOTE — Telephone Encounter (Signed)
Pt. Mom came in stating the Pt. Couldn't get MRI done at [facility] and needs a new Referral to a different place

## 2023-02-20 NOTE — Telephone Encounter (Signed)
 Agree with trying to get MRI at [facility] with his VNS. Not sure what the issue at [facility] was.   Pls let mom know that the EEG showed seizure activity from the right side of his brain. The next step would be doing the brain MRI. It is difficult with Daniel Williams, but it is very important that he takes his medications as instructed or if his mother or another family member can check behind him that he is taking them correctly. Thanks

## 2023-02-20 NOTE — Telephone Encounter (Signed)
Pt mother called no answer left a voice mail to call the office back  

## 2023-02-21 NOTE — Telephone Encounter (Signed)
Pt mother called no answer left a voice mail to call the office back  

## 2023-02-22 NOTE — Telephone Encounter (Signed)
Pt mother called no answer left a voice mail to call the office back  

## 2023-02-23 NOTE — Telephone Encounter (Signed)
 Done, they need to sign the front page, thanks

## 2023-02-23 NOTE — Telephone Encounter (Signed)
 Spoke with pt mother informed her that the EEG showed seizure activity from the right side of his brain. The next step would be doing the brain MRI. It is difficult with Daniel Williams, but it is very important that he takes his medications as instructed or if his mother or another family member can check behind him that he is taking them correctly. Daniel Williams's mother is going to take him to get a large pill box so she can help monitor his medication

## 2023-02-26 NOTE — Telephone Encounter (Signed)
 Pt mother called they came an picked up paperwork

## 2023-03-29 ENCOUNTER — Telehealth: Payer: Self-pay

## 2023-03-29 MED ORDER — XCOPRI 50 MG PO TABS: ORAL_TABLET | ORAL | 5 refills | Status: DC

## 2023-03-29 NOTE — Telephone Encounter (Signed)
 Pr called he has been out of medication for a while he needs refills of the xcopri he has had 7-8 seizures

## 2023-03-29 NOTE — Telephone Encounter (Signed)
-----   Message from Van Clines sent at 03/28/2023  1:06 PM EDT ----- Regarding: Edison Nasuti PCP sent a note that patient saying he has no refills for Xcopri, I sent for 5 refills in November, pls let patient know he should have them. I can send more if there is an issue.  Thanks ----- Message ----- From: Jonetta Osgood Sent: 03/21/2023   7:20 AM EDT To: Van Clines, MD

## 2023-03-29 NOTE — Telephone Encounter (Signed)
 5 refills sent to [facility] per patient request

## 2023-03-30 ENCOUNTER — Telehealth: Payer: Self-pay

## 2023-03-30 NOTE — Telephone Encounter (Signed)
 Pt called an informed that his MRI is scheduled for 04/04/23 he needs to be there at 3:15 and his test is 3:30. Pt said he will let his mom know,   PT Auth number [id number] Ref number [id number] Dates 03/30/23-04/30/23

## 2023-04-02 ENCOUNTER — Telehealth: Payer: Self-pay

## 2023-04-02 MED ORDER — XCOPRI 50 MG PO TABS: ORAL_TABLET | ORAL | 5 refills | Status: DC

## 2023-04-02 NOTE — Telephone Encounter (Signed)
 Telephone call from AN Patient states he has been out of his medication for a while.   Patient had a seizure on Saturday and hit his head.    Per patient he was unable to get his medication Xcopri through [facility] until five days from now.   Per patient please send to local pharmacy [facility] [address]

## 2023-04-02 NOTE — Telephone Encounter (Signed)
 Per patient he is taking  Zonisamide 500mg  at bedtime, carbamazepine ER 300mg  1 cap in AM, 2 caps in PM and Levetiracetam 750mg  2 tabs BID.  [facility] will not be able to pick up Xcopri he will have to wait until 04/21/23.     Will [facility] to see if he could get a Emergency stock until he can get his 30 days from [facility].

## 2023-04-02 NOTE — Telephone Encounter (Signed)
 Pls let patient know I sent in the Xcopri to [facility] [address]. Pls make sure he has all his other seizure medications, thanks

## 2023-04-04 ENCOUNTER — Ambulatory Visit (HOSPITAL_COMMUNITY)
Admission: RE | Admit: 2023-04-04 | Discharge: 2023-04-04 | Disposition: A | Discharge status: Home / Self Care | Source: Ambulatory Visit | Attending: Neurology | Admitting: Neurology

## 2023-04-04 ENCOUNTER — Telehealth: Payer: Self-pay

## 2023-04-04 DIAGNOSIS — G40219 Localization-related (focal) (partial) symptomatic epilepsy and epileptic syndromes with complex partial seizures, intractable, without status epilepticus: Secondary | ICD-10-CM

## 2023-04-04 MED ORDER — GADOBUTROL 1 MMOL/ML IV SOLN
5.0000 mL | Freq: Once | INTRAVENOUS | Status: AC | PRN
  Administered 2023-04-04: 5 mL via INTRAVENOUS

## 2023-04-04 NOTE — Telephone Encounter (Signed)
 Medication Samples have been provided to the patient.  Drug name: xcopri       Strength: 12.5/25mg         Qty: 1  LOT: [id number]  Exp.Date: 9/26  Dosing instructions: take as directed 50mg  every night   The patient has been instructed regarding the correct time, dose, and frequency of taking this medication, including desired effects and most common side effects.

## 2023-04-04 NOTE — Telephone Encounter (Signed)
 Patient came in to have VNS turned off prior to MRI. Output current set from 0.25 to 0 mA, magnet from 0.5 to 0 mA. Patient tolerated changes and will return tomorrow morning to resume settings.

## 2023-04-06 ENCOUNTER — Encounter: Payer: Self-pay | Admitting: Neurology

## 2023-04-06 ENCOUNTER — Other Ambulatory Visit

## 2023-04-06 ENCOUNTER — Ambulatory Visit (INDEPENDENT_AMBULATORY_CARE_PROVIDER_SITE_OTHER): Payer: Medicare HMO | Admitting: Neurology

## 2023-04-06 VITALS — BP 127/85 | HR 73 | Ht 69.0 in | Wt 128.8 lb

## 2023-04-06 DIAGNOSIS — G40219 Localization-related (focal) (partial) symptomatic epilepsy and epileptic syndromes with complex partial seizures, intractable, without status epilepticus: Secondary | ICD-10-CM

## 2023-04-06 NOTE — Progress Notes (Signed)
 NEUROLOGY FOLLOW UP OFFICE NOTE  NOA CONSTANTE [id number] 1975-02-05  HISTORY OF PRESENT ILLNESS: I had the pleasure of seeing Daniel Williams in follow-up in the neurology clinic on 04/06/2023.  The patient was last seen 4 months ago for co-existing intractable epilepsy s/p VNS placement and functional seizures. He is again accompanied by his mother Daniel Williams who helps supplement the history today.  Records and images were personally reviewed where available.  He had a brain MRI with and without contrast yesterday which I personally reviewed, there were no acute changes seen, hippocampi symmetric with no abnormal signal or enhancement seen. His 1-hour EEG in 05/2022 showed occasional slowing in the right temporal region and frequent right frontotemporal epileptiform discharges. It appeared there was some improvement with addition of Xcopri, but he has been having difficulties getting his medication. He continues on Zonisamide 500mg  at bedtime, Carbamazepine ER 300mg  1 cap in AM, 2 caps in PM, and Levetiracetam 750mg  2 tabs BID without side effects.   He was reporting running out of Xcopri 50mg  tablets for unclear reasons. Prescription sent in November had 5 refills, there is only 1 fill from December. He was given samples until [facility] supply arrives, PMPAware review indicates it was filled 04/02/23 at [facility]. He has not checked his mailbox since yesterday. He reports a seizure on 03/02/23, he was alone walking on [address], the ambulance came but he got back on the bus and went home. The last seizure was 03/31/23, he was about to go to church and felt "everything happening at one time," then waking up on the ground. A friend from church, Mr. Daniel Williams and his neighbor said he fell on the ground, they said he blacked out, no jerking reported. He hit his head and has a band-aid on the right parietal region. We discussed his VNS, it is set at a very low output current of 0.28mA, he states he does not want  his voice to change and so stimulation was kept at 0.69mA.    History On Initial Assessment 05/18/2022: This is a 48 year old right-handed man with a history of hypertension, co-existing intractable epilepsy s/p VNS implantation and non-epileptic events, presenting to establish care. Records were reviewed. He reports seizures started in childhood. Sometimes he gets a feeling and knows he needs to sit down, or his eyes start messing up, then he wakes up with injuries. He always bites inside of his gums on the left, last occurrence was a couple of weeks ago. He reports waking up with injuries on his back, left was a couple of weeks ago. He lives alone. No focal weakness, he feels drained and tired after. He has difficulty determining seizure frequency but states he does not have them like before, he thinks he had 4 seizures in the last 2 months. Seizure triggers include stress, getting worked up, heat. He denies any olfactory/gustatory hallucinations, deja vu, rising epigastric sensation, focal numbness/tingling/weakness, myoclonic jerks. He has migraines once in a while with good response to Tylenol or Ibuprofen. No nausea/vomiting, photo/phonophobia. There is a strong family history of migraines in his mother, maternal grandfather, and paternal grandmother). No dizziness, diplopia, dysarthria/dysphagia, neck pain. He has some back pain and occasional constipation. He gets around 12 hours of sleep. He lives alone. 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   Records from [facility] were reviewed, last visit with epileptologist Dr. Craig Staggers was in 03/2022. Per records, Zonisamide was increased  to 500mg  a day after level of 14 in 05/2021. Carbamazepine level was 12.3, Levetiracetam level 31.3. He is on Carbatrol 300mg  in AM, 600mg  in PM, Levetiracetam 1500mg  BID, and states he has been taking Zonisamide 400mg  qhs. VNS was placed in 2008, replaced in 2019. His last  vEEG monitoring in 03/2005 showed interictal discharges in various regions throughout the right hemisphere as well as run of rhythmic activity in the right central temporal head region, which electrographically was at least suspicious for being electrographic seizure. In 1998, vEEG captured 14 events, 13 had no electrographic correlate and felt to be non-epileptic seizures. One electrographic seizure was seen with onset in the occipital head region and was consistent with a complex partial seizure with secondary generalization. 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   Epilepsy Risk Factors:  His paternal grandmother had blackouts but was not diagnosed with seizures. He was in special education classes, finished 12th grade and did some college. There is no history of febrile convulsions, CNS infections such as meningitis/encephalitis, significant traumatic brain injury, neurosurgical procedures.  Prior ASMs: Depakote, Diamox, Neurontin, Topamax, Dilantin, and Lamictal.  Diagnostic Data: MRI brain with and without contrast 03/2023: no acute changes, hippocampi symmetric with no abnormal signal or enhancement  vEEG monitoring in 03/2005 showed interictal discharges in various regions throughout the right hemisphere as well as run of rhythmic activity in the right central temporal head region, which electrographically was at least suspicious for being electrographic seizure. In 1998, vEEG captured 14 events, 13 had no electrographic correlate and felt to be non-epileptic seizures. One electrographic seizure was seen with onset in the occipital head region and was consistent with a complex partial seizure with secondary generalization.  1-hour EEG in 05/2022 showed occasional focal slowing over the right temporal region, frequent right  frontotemporal epileptiform discharges   PAST MEDICAL HISTORY: Past Medical History:  Diagnosis Date   Hypertension    Seizures (HCC)     MEDICATIONS: Current Outpatient Medications on File Prior to Visit  Medication Sig Dispense Refill   acetaminophen (TYLENOL) 500 MG tablet Take 1 tablet (500 mg total) by mouth every 6 (six) hours as needed for mild pain or moderate pain. 30 tablet 0   amLODipine (NORVASC) 5 MG tablet Take 5 mg by mouth daily.     carbamazepine (CARBATROL) 300 MG 12 hr capsule Take 1 capsule in morning, 2 capsules in evening 90 capsule 11   Cenobamate (XCOPRI) 50 MG TABS Take 1 tablet every night 30 tablet 5   levETIRAcetam (KEPPRA) 750 MG tablet Take 2 tablets twice a day 120 tablet 11   multivitamin (ONE-A-DAY MEN'S) TABS tablet Take 1 tablet by mouth every morning.     psyllium (METAMUCIL SMOOTH TEXTURE) 58.6 % powder Take 1 packet by mouth 3 (three) times daily. 283 g 12   zonisamide (ZONEGRAN) 100 MG capsule Take 5 capsules every night 150 capsule 11   No current facility-administered medications on file prior to visit.    ALLERGIES: No Known Allergies  FAMILY HISTORY: Family History  Problem Relation Age of Onset   Colon cancer Neg Hx    Colon polyps Neg Hx    Esophageal cancer Neg Hx    Rectal cancer Neg Hx    Stomach cancer Neg Hx     SOCIAL HISTORY: Social History   Socioeconomic History   Marital  status: Single    Spouse name: Not on file   Number of children: Not on file   Years of education: Not on file   Highest education level: Not on file  Occupational History   Not on file  Tobacco Use   Smoking status: Never   Smokeless tobacco: Never  Vaping Use   Vaping status: Never Used  Substance and Sexual Activity   Alcohol use: No   Drug use: Not Currently   Sexual activity: Not on file  Other Topics Concern   Not on file  Social History Narrative   Are you right handed or left handed? Right    Are you currently employed ? no    What is your current occupation? [employer] volunteer work   Do you live at home alone? yes   Who lives with you? NA   What type of home do you live in: 1 story or 2 story? 1 story        Social Drivers of Corporate investment banker Strain: Low Risk  (03/17/2023)   Received from [facility]   Overall Financial Resource Strain (CARDIA)    Difficulty of Paying Living Expenses: Not very hard  Food Insecurity: Food Insecurity Present (03/17/2023)   Received from [facility]   Hunger Vital Sign    Worried About Running Out of Food in the Last Year: Sometimes true    Ran Out of Food in the Last Year: Patient declined  Transportation Needs: Unknown (03/17/2023)   Received from [facility] - Transportation    Lack of Transportation (Medical): No    Lack of Transportation (Non-Medical): Patient declined  Physical Activity: Unknown (03/17/2023)   Received from [facility]   Exercise Vital Sign    Days of Exercise per Week: 4 days    Minutes of Exercise per Session: Not on file  Stress: No Stress Concern Present (03/17/2023)   Received from [facility] - Occupational Stress Questionnaire    Feeling of Stress : Not at all  Social Connections: Socially Integrated (03/17/2023)   Received from [facility]   Social Network    How would you rate your social network (family, work, friends)?: Good participation with social networks  Intimate Partner Violence: Not At Risk (03/17/2023)   Received from [facility]   HITS    Over the last 12 months how often did your partner physically hurt you?: Never    Over the last 12 months how often did your partner insult you or talk down to you?: Never    Over the last 12 months how often did your partner threaten you with physical harm?: Never    Over the last 12 months how often did your partner scream or curse at you?: Never     PHYSICAL EXAM: Vitals:   04/06/23 1100  BP: 127/85  Pulse: 73  SpO2:  100%   General: No acute distress Head:  Normocephalic, band-aid on right parietal region from recent fall  Skin/Extremities: No rash, no edema Neurological Exam: alert and awake. No aphasia or dysarthria. Fund of knowledge is appropriate. Attention and concentration are reduced.  Cranial nerves: Pupils equal, round. Extraocular movements intact.  No facial asymmetry.  Motor: moves all extremities symmetrically at least anti-gravity x 4.  Gait narrow-based and steady, no ataxia.   VNS Therapy Management: Unit Information Implant Date: 07/05/17 Serial Number: [id number] Generator Number: [id number] (Aspire SR [id number]) Parameters Output Current (  mA): 0.375 Signal Frequency (Hz): 30 Pulse Width (usec): 500 Signal ON Time (sec): 30 Signal OFF Time (min): 3 Magnet Output Current (mA): 0.5 Magnet ON Time (sec): 60 Magnet Pulse Width (usec): 250 Tachycardia Detection : Off (Disabled) Perform Verify Heartbeat Detection: no   IMPRESSION: This is a 48 yo RH man with a history of hypertension, co-existing intractable epilepsy s/p VNS implantation and non-epileptic events. Prior EEG reported interictal discharges in various regions throughout the right hemisphere as well as run of rhythmic activity in the right central temporal head region, most recent EEG showed frequent right frontotemporal epileptiform discharges. MRI brain normal. He has run out of Xcopri for unclear reasons, there were 5 refills called in last November. A new prescription was sent and per records, filled 04/02/23. Continue Xcopri 50mg  at bedtime, Zonisamide 500mg  at bedtime, carbamazepine ER 300mg  1 cap in AM, 2 caps in PM and Levetiracetam 750mg  2 tabs BID. He reports compliance to his medications, check CBC, CMP, Keppra, Zonisamide, and carbamazepine levels. We discussed VNS side effects, it is common to have a change in voice, he denies any pain. He agreed to a very slight increase in output current to 0.341mA today and tolerated it in the  office. He does not drive. He was advised to keep a seizure diary, follow-up in 3 months, call for any changes.    Thank you for allowing me to participate in his care.  Please do not hesitate to call for any questions or concerns.    Patrcia Dolly, M.D.   CC: Dr. Everlene Other

## 2023-04-06 NOTE — Patient Instructions (Signed)
 Good to see you.  Have bloodwork done for CBC, CMP, Keppra level, Carbamazepine level, Zonisamide level  2. Please follow-up with [facility] on the Xcopri  3. Keep a calendar of your seizures. Continue all your medications  4. Follow-up in 3 months, call for any changes   Seizure Precautions: 1. If medication has been prescribed for you to prevent seizures, take it exactly as directed.  Do not stop taking the medicine without talking to your doctor first, even if you have not had a seizure in a long time.   2. Avoid activities in which a seizure would cause danger to yourself or to others.  Don't operate dangerous machinery, swim alone, or climb in high or dangerous places, such as on ladders, roofs, or girders.  Do not drive unless your doctor says you may.  3. If you have any warning that you may have a seizure, lay down in a safe place where you can't hurt yourself.    4.  No driving for 6 months from last seizure, as per [facility]   Please refer to the following link on the Epilepsy Foundation of America's website for more information: http://www.epilepsyfoundation.org/answerplace/Social/driving/drivingu.cfm   5.  Maintain good sleep hygiene. Avoid alcohol.  6.  Contact your doctor if you have any problems that may be related to the medicine you are taking.  7.  Call 911 and bring the patient back to the ED if:        A.  The seizure lasts longer than 5 minutes.       B.  The patient doesn't awaken shortly after the seizure  C.  The patient has new problems such as difficulty seeing, speaking or moving  D.  The patient was injured during the seizure  E.  The patient has a temperature over 102 F (39C)  F.  The patient vomited and now is having trouble breathing

## 2023-04-11 ENCOUNTER — Telehealth: Payer: Self-pay

## 2023-04-11 NOTE — Telephone Encounter (Signed)
 Pt called an informed that bloodwork looked good,

## 2023-04-11 NOTE — Telephone Encounter (Signed)
-----   Message from Van Clines sent at 04/11/2023  1:30 PM EDT ----- Pls let him know bloodwork looked good, thanks

## 2023-04-12 LAB — LEVETIRACETAM, IMMUNOASSAY: LEVETIRACETAM, IMMUNOASSAY: 57.4 ug/mL — ABNORMAL HIGH (ref 6.0–46.0)

## 2023-04-12 LAB — CBC
HCT: 45.2 % (ref 38.5–50.0)
Hemoglobin: 15.9 g/dL (ref 13.2–17.1)
MCH: 32.3 pg (ref 27.0–33.0)
MCHC: 35.2 g/dL (ref 32.0–36.0)
MCV: 91.9 fL (ref 80.0–100.0)
MPV: 9.4 fL (ref 7.5–12.5)
Platelets: 258 10*3/uL (ref 140–400)
RBC: 4.92 10*6/uL (ref 4.20–5.80)
RDW: 12.7 % (ref 11.0–15.0)
WBC: 4.7 10*3/uL (ref 3.8–10.8)

## 2023-04-12 LAB — COMPREHENSIVE METABOLIC PANEL WITH GFR
AG Ratio: 1.8 (calc) (ref 1.0–2.5)
ALT: 17 U/L (ref 9–46)
AST: 14 U/L (ref 10–40)
Albumin: 5.1 g/dL (ref 3.6–5.1)
Alkaline phosphatase (APISO): 75 U/L (ref 36–130)
BUN: 13 mg/dL (ref 7–25)
CO2: 25 mmol/L (ref 20–32)
Calcium: 9.6 mg/dL (ref 8.6–10.3)
Chloride: 104 mmol/L (ref 98–110)
Creat: 1.12 mg/dL (ref 0.60–1.29)
Globulin: 2.9 g/dL (ref 1.9–3.7)
Glucose, Bld: 91 mg/dL (ref 65–99)
Potassium: 3.9 mmol/L (ref 3.5–5.3)
Sodium: 141 mmol/L (ref 135–146)
Total Bilirubin: 0.3 mg/dL (ref 0.2–1.2)
Total Protein: 8 g/dL (ref 6.1–8.1)
eGFR: 82 mL/min/{1.73_m2} (ref >=60)

## 2023-04-12 LAB — ZONISAMIDE LEVEL: Zonisamide: 45.6 ug/mL — ABNORMAL HIGH (ref 10.0–40.0)

## 2023-04-12 LAB — CARBAMAZEPINE LEVEL, TOTAL: Carbamazepine Lvl: 14 mg/L — ABNORMAL HIGH (ref 4.0–12.0)

## 2023-05-30 ENCOUNTER — Encounter: Payer: Self-pay | Admitting: Neurology

## 2023-05-30 ENCOUNTER — Telehealth: Payer: Self-pay | Admitting: Neurology

## 2023-05-30 NOTE — Telephone Encounter (Signed)
Letter done, thanks.

## 2023-05-30 NOTE — Telephone Encounter (Signed)
 Maggie called from the orthodontics office and LM with AN. Dr.Mcmillian needs clearance letter in order for pt to get braces since they have been aware of epilepsy

## 2023-05-31 NOTE — Telephone Encounter (Signed)
 Letter has been faxed.

## 2023-07-10 ENCOUNTER — Telehealth: Payer: Self-pay

## 2023-07-10 ENCOUNTER — Telehealth: Payer: Self-pay | Admitting: Pharmacy Technician

## 2023-07-10 ENCOUNTER — Encounter: Payer: Self-pay | Admitting: Neurology

## 2023-07-10 ENCOUNTER — Other Ambulatory Visit (HOSPITAL_COMMUNITY): Payer: Self-pay

## 2023-07-10 ENCOUNTER — Ambulatory Visit (INDEPENDENT_AMBULATORY_CARE_PROVIDER_SITE_OTHER): Admitting: Neurology

## 2023-07-10 VITALS — BP 113/74 | HR 87 | Ht 69.0 in | Wt 119.4 lb

## 2023-07-10 DIAGNOSIS — G40219 Localization-related (focal) (partial) symptomatic epilepsy and epileptic syndromes with complex partial seizures, intractable, without status epilepticus: Secondary | ICD-10-CM | POA: Diagnosis not present

## 2023-07-10 NOTE — Telephone Encounter (Signed)
 Pharmacy Patient Advocate Encounter   Received notification from CoverMyMeds that prior authorization for XCOPRI  50MG  is required/requested.   Insurance verification completed.   The patient is insured through [employer] .   Per test claim: PA required; PA submitted to above mentioned insurance via CoverMyMeds Key/confirmation #/EOC [id number] Status is pending

## 2023-07-10 NOTE — Patient Instructions (Signed)
 Good to see you.  Restart the Xcopri  starter pack, then once done with pack, hopefully your mail order has arrived and you can start prescription. Let us  know if you don't get it.  2. Continue all your other medications  3. Swipe the magnet twice a day to get used to the new stimulation  4. Continue seizure calendar, follow-up in 3-4 months, call for any changes   Seizure Precautions: 1. If medication has been prescribed for you to prevent seizures, take it exactly as directed.  Do not stop taking the medicine without talking to your doctor first, even if you have not had a seizure in a long time.   2. Avoid activities in which a seizure would cause danger to yourself or to others.  Don't operate dangerous machinery, swim alone, or climb in high or dangerous places, such as on ladders, roofs, or girders.  Do not drive unless your doctor says you may.  3. If you have any warning that you may have a seizure, lay down in a safe place where you can't hurt yourself.    4.  No driving for 6 months from last seizure, as per [place]  state law.   Please refer to the following link on the Epilepsy Foundation of America's website for more information: http://www.epilepsyfoundation.org/answerplace/Social/driving/drivingu.cfm   5.  Maintain good sleep hygiene. Avoid alcohol  6.  Contact your doctor if you have any problems that may be related to the medicine you are taking.  7.  Call 911 and bring the patient back to the ED if:        A.  The seizure lasts longer than 5 minutes.       B.  The patient doesn't awaken shortly after the seizure  C.  The patient has new problems such as difficulty seeing, speaking or moving  D.  The patient was injured during the seizure  E.  The patient has a temperature over 102 F (39C)  F.  The patient vomited and now is having trouble breathing

## 2023-07-10 NOTE — Telephone Encounter (Signed)
 PA has been submitted, and telephone encounter has been created. Please see telephone encounter dated 6.24.25.  Pharmacy can submit an override code for patient to receive a one month supply until PA comes back.

## 2023-07-10 NOTE — Telephone Encounter (Signed)
 Pharmacy is sending 1 months supply while we are waiting for PA

## 2023-07-10 NOTE — Progress Notes (Signed)
 Pt was out side in the heat had a seizure on the June 20th and 21st out of xcopri  for a week

## 2023-07-10 NOTE — Telephone Encounter (Signed)
 Pt is in the office he needs an URGENT PA on xcorpi for [facility] he is out of medication and having seizures

## 2023-07-10 NOTE — Progress Notes (Signed)
 Medication Samples have been provided to the patient.  Drug name: xcopri        Strength: 12.5 / 25 mg        Qty:  1        LOT: [id number]     Exp.Date: 9/26  Dosing instructions: take as directed   The patient has been instructed regarding the correct time, dose, and frequency of taking this medication, including desired effects and most common side effects.   Daniel Williams  1:52 PM 07/10/2023

## 2023-07-10 NOTE — Progress Notes (Signed)
 NEUROLOGY FOLLOW UP OFFICE NOTE  Daniel Williams [id number] 1975-09-16  HISTORY OF PRESENT ILLNESS: I had the pleasure of seeing Daniel Williams in follow-up in the neurology clinic on 07/10/2023.  The patient was last seen 3 months ago for coexisting intractable epilepsy s/p VNS placement and functional seizures. He is alone in the office today. Records and images were personally reviewed where available.  On his last visit, VNS output current was increased to 0.358mA which he tolerated without difficulties. He reports that he still has not received the Xcopri , our office will follow-up on this. He continues on Zonisamide  500mg  at bedtime, Carbamazepine  ER 300mg  1 cap in AM, 2 caps in PM, and Levetiracetam  750mg  2 tabs BID without side effects. He reports a total of 9 seizures in the past 3 months, most recently on 6/20 and 6/21 while out in the heat. He has noticed heat is a trigger for his seizures. He fell and was brought in the house. He denies missing any medications. No headaches, dizziness, focal numbness/tingling/weakness. He recently had braces placed on the top teeth and will get the bottom teeth done soon.    History On Initial Assessment 05/18/2022: This is a 48 year old right-handed man with a history of hypertension, co-existing intractable epilepsy s/p VNS implantation and non-epileptic events, presenting to establish care. Records were reviewed. He reports seizures started in childhood. Sometimes he gets a feeling and knows he needs to sit down, or his eyes start messing up, then he wakes up with injuries. He always bites inside of his gums on the left, last occurrence was a couple of weeks ago. He reports waking up with injuries on his back, left was a couple of weeks ago. He lives alone. No focal weakness, he feels drained and tired after. He has difficulty determining seizure frequency but states he does not have them like before, he thinks he had 4 seizures in the last 2 months.  Seizure triggers include stress, getting worked up, heat. He denies any olfactory/gustatory hallucinations, deja vu, rising epigastric sensation, focal numbness/tingling/weakness, myoclonic jerks. He has migraines once in a while with good response to Tylenol  or Ibuprofen. No nausea/vomiting, photo/phonophobia. There is a strong family history of migraines in his mother, maternal grandfather, and paternal grandmother). No dizziness, diplopia, dysarthria/dysphagia, neck pain. He has some back pain and occasional constipation. He gets around 12 hours of sleep. He lives alone. 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   [facility] were reviewed, last visit with epileptologist Dr. Donnetta was in 03/2022. Per records, Zonisamide  was increased to 500mg  a day after level of 14 in 05/2021. Carbamazepine  level was 12.3, Levetiracetam  level 31.3. He is on Carbatrol  300mg  in AM, 600mg  in PM, Levetiracetam  1500mg  BID, and states he has been taking Zonisamide  400mg  qhs. VNS was placed in 2008, replaced in 2019. His last vEEG monitoring in 03/2005 showed interictal discharges in various regions throughout the right hemisphere as well as run of rhythmic activity in the right central temporal head region, which electrographically was at least suspicious for being electrographic seizure. In 1998, vEEG captured 14 events, 13 had no electrographic correlate and felt to be non-epileptic seizures. One electrographic seizure was seen with onset in the occipital head region and was consistent with a complex partial seizure with secondary generalization. 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   Epilepsy Risk Factors:  His paternal grandmother had blackouts but was not diagnosed with seizures. He was in  special education classes, finished 12th grade and did some college. There is no history of febrile convulsions, CNS infections such as meningitis/encephalitis, significant traumatic brain injury, neurosurgical procedures.  Prior ASMs: Depakote, Diamox, Neurontin, Topamax, Dilantin, and Lamictal.  Diagnostic Data: MRI brain with and without contrast 03/2023: no acute changes, hippocampi symmetric with no abnormal signal or enhancement  vEEG monitoring in 03/2005 showed interictal discharges in various regions throughout the right hemisphere as well as run of rhythmic activity in the right central temporal head region, which electrographically was at least suspicious for being electrographic seizure. In 1998, vEEG captured 14 events, 13 had no electrographic correlate and felt to be non-epileptic seizures. One electrographic seizure was seen with onset in the occipital head region and was consistent with a complex partial seizure with secondary generalization.  1-hour EEG in 05/2022 showed occasional focal slowing over the right temporal region, frequent right frontotemporal epileptiform discharges   PAST MEDICAL HISTORY: Past Medical History:  Diagnosis Date   Hypertension    Seizures (HCC)     MEDICATIONS: Current Outpatient Medications on File Prior to Visit  Medication Sig Dispense Refill   acetaminophen  (TYLENOL ) 500 MG tablet Take 1 tablet (500 mg total) by mouth every 6 (six) hours as needed for mild pain or moderate pain. 30 tablet 0   amLODipine (NORVASC) 5 MG tablet Take 5 mg by mouth daily.     carbamazepine  (CARBATROL ) 300 MG 12 hr capsule Take 1 capsule in morning, 2 capsules in evening 90 capsule 11   levETIRAcetam  (KEPPRA ) 750 MG tablet Take 2 tablets twice a day 120 tablet 11   multivitamin (ONE-A-DAY MEN'S) TABS tablet Take 1 tablet by mouth every morning.     psyllium (METAMUCIL SMOOTH TEXTURE) 58.6 % powder Take 1 packet by mouth 3 (three) times daily. 283 g 12   tiZANidine  (ZANAFLEX) 4 MG tablet Take 4 mg by mouth 3 (three) times daily.     zonisamide  (ZONEGRAN ) 100 MG capsule Take 5 capsules every night 150 capsule 11   Cenobamate  (XCOPRI ) 50 MG TABS Take 1 tablet every night (Patient not taking: Reported on 07/10/2023) 30 tablet 5   No current facility-administered medications on file prior to visit.    ALLERGIES: No Known Allergies  FAMILY HISTORY: Family History  Problem Relation Age of Onset   Colon cancer Neg Hx    Colon polyps Neg Hx    Esophageal cancer Neg Hx    Rectal cancer Neg Hx    Stomach cancer Neg Hx     SOCIAL HISTORY: Social History   Socioeconomic History   Marital status: Single    Spouse name: Not on file   Number of children: Not on file   Years of education: Not on file   Highest education level: Not on file  Occupational History   Not on file  Tobacco Use   Smoking status: Never   Smokeless tobacco: Never  Vaping Use   Vaping status: Never Used  Substance and Sexual Activity   Alcohol use: No   Drug use: Not Currently   Sexual activity: Not on file  Other Topics Concern   Not on file  Social History Narrative   Are you right handed or left handed? Right    Are you currently employed ? no   What  is your current occupation? [employer] volunteer work   Do you live at home alone? yes   Who lives with you? NA   What type of home do you live in: 1 story or 2 story? 1 story        Social Drivers of Corporate investment banker Strain: Low Risk  (03/17/2023)   Received from [facility]   Overall Financial Resource Strain (CARDIA)    Difficulty of Paying Living Expenses: Not very hard  Food Insecurity: Food Insecurity Present (03/17/2023)   Received from [facility]   Hunger Vital Sign    Within the past 12 months, you worried that your food would run out before you got the money to buy more.: Sometimes true    Within the past 12 months, the food you bought just didn't last and you didn't have money to get more.:  Patient declined  Transportation Needs: Unknown (03/17/2023)   Received from [facility] - Transportation    Lack of Transportation (Medical): No    Lack of Transportation (Non-Medical): Patient declined  Physical Activity: Unknown (03/17/2023)   Received from [facility]   Exercise Vital Sign    On average, how many days per week do you engage in moderate to strenuous exercise (like a brisk walk)?: 4 days    Minutes of Exercise per Session: Not on file  Stress: No Stress Concern Present (03/17/2023)   Received from [facility] of Occupational Health - Occupational Stress Questionnaire    Feeling of Stress : Not at all  Social Connections: Socially Integrated (03/17/2023)   Received from [facility]   Social Network    How would you rate your social network (family, work, friends)?: Good participation with social networks  Intimate Partner Violence: Not At Risk (03/17/2023)   Received from [facility]   HITS    Over the last 12 months how often did your partner physically hurt you?: Never    Over the last 12 months how often did your partner insult you or talk down to you?: Never    Over the last 12 months how often did your partner threaten you with physical harm?: Never    Over the last 12 months how often did your partner scream or curse at you?: Never     PHYSICAL EXAM: Vitals:   07/10/23 1249  BP: 113/74  Pulse: 87  SpO2: 99%   General: No acute distress Head:  Normocephalic/atraumatic Skin/Extremities: No rash, no edema Neurological Exam: alert and awake. No aphasia or dysarthria. Fund of knowledge is appropriate.  Attention and concentration are normal.   Cranial nerves: Pupils equal, round. Extraocular movements intact with no nystagmus. Visual fields full.  No facial asymmetry.  Motor: Bulk and tone normal, muscle strength 5/5 throughout with no pronator drift.   Finger to nose testing intact.  Gait narrow-based and steady, able to tandem  walk adequately.  Romberg negative.  VNS Therapy Management: Parameters Output Current (mA): 0.5 Signal Frequency (Hz): 30 Pulse Width (usec): 500 Signal ON Time (sec): 30 Signal OFF Time (min): 3 Magnet Output Current (mA): 0.625 Magnet ON Time (sec): 60 Magnet Pulse Width (usec): 250   IMPRESSION: This is a 48 yo RH man with a history of hypertension, co-existing intractable epilepsy s/p VNS implantation and non-epileptic events. Prior EEG reported interictal discharges in various regions throughout the right hemisphere as well as run of rhythmic activity in the right central temporal head region,  most recent EEG showed frequent right frontotemporal epileptiform discharges. MRI brain normal. He reports 9 seizures in the past 3 months. He still has not received Xcopri , we will restart starter pack at 12.5mg  at bedtime  for 2 weeks, then 25mg  at bedtime for 2 weeks, hopefully he will received his mail-order prescription for 50mg  at bedtime once starter pack is done. Continue Zonisamide  500mg  at bedtime, carbamazepine  ER 300mg  1 cap in AM, 2 caps in PM and Levetiracetam  750mg  2 tabs BID. VNS interrogated today, output current increased to 0.66mA which he tolerated without difficulties. He does not drive. Continue seizure calendar. Follow-up in 3-4 months, call for any changes.   Thank you for allowing me to participate in his care.  Please do not hesitate to call for any questions or concerns.   Darice Shivers, M.D.   CC: Dr. Pura

## 2023-07-10 NOTE — Telephone Encounter (Signed)
 Pharmacy Patient Advocate Encounter  Received notification from [employer] that Prior Authorization for XCOPRI  50MG  has been APPROVED from 1.1.25 to 12.31.25. Ran test claim, Copay is $0. This test claim was processed through [facility]- copay amounts may vary at other pharmacies due to pharmacy/plan contracts, or as the patient moves through the different stages of their insurance plan.   PA #/Case ID/Reference #: [id number]

## 2023-07-12 ENCOUNTER — Other Ambulatory Visit: Payer: Self-pay

## 2023-07-12 ENCOUNTER — Telehealth: Payer: Self-pay | Admitting: Neurology

## 2023-07-12 NOTE — Telephone Encounter (Signed)
Sent to Dr. [name] 

## 2023-07-12 NOTE — Telephone Encounter (Signed)
 [facility] needs Nurse or Doc to call to complete refill of Prior Authorized Rx XCOPRI  50MG  [facility] [address]

## 2023-07-30 MED ORDER — XCOPRI 50 MG PO TABS: ORAL_TABLET | ORAL | 3 refills | Status: DC

## 2023-07-31 ENCOUNTER — Other Ambulatory Visit (HOSPITAL_COMMUNITY): Payer: Self-pay

## 2023-10-07 ENCOUNTER — Other Ambulatory Visit: Payer: Self-pay | Admitting: Neurology

## 2023-10-17 ENCOUNTER — Encounter: Payer: Self-pay | Admitting: Neurology

## 2023-10-17 ENCOUNTER — Ambulatory Visit: Admitting: Neurology

## 2023-10-17 VITALS — BP 149/96 | HR 80 | Ht 69.0 in | Wt 121.0 lb

## 2023-10-17 DIAGNOSIS — G40219 Localization-related (focal) (partial) symptomatic epilepsy and epileptic syndromes with complex partial seizures, intractable, without status epilepticus: Secondary | ICD-10-CM | POA: Diagnosis not present

## 2023-10-17 MED ORDER — ZONISAMIDE 100 MG PO CAPS: ORAL_CAPSULE | ORAL | 3 refills | Status: DC

## 2023-10-17 MED ORDER — XCOPRI 50 MG PO TABS: ORAL_TABLET | ORAL | 3 refills | Status: DC

## 2023-10-17 MED ORDER — CARBAMAZEPINE ER 300 MG PO CP12: ORAL_CAPSULE | ORAL | 3 refills | Status: DC

## 2023-10-17 MED ORDER — LEVETIRACETAM 750 MG PO TABS: 1500.0000 mg | ORAL_TABLET | Freq: Two times a day (BID) | ORAL | 3 refills | Status: DC

## 2023-10-17 NOTE — Progress Notes (Signed)
 NEUROLOGY FOLLOW UP OFFICE NOTE  Daniel Williams [id number] April 20, 1975  HISTORY OF PRESENT ILLNESS: I had the pleasure of seeing Daniel Williams in follow-up in the neurology clinic on 10/17/2023.  The patient was last seen 3 months ago for coexisting intractable epilepsy s/p VNS placement and functional seizures. He is alone in the office today.  Records and images were personally reviewed where available.  Since his last visit, he reports 7-8 seizures in 3 months. He cannot recall when they were, and cannot recall if he had any injuries. He states he ran out of Xcopri  50mg  today. He is also on Zonisamide  500mg  at bedtime, Carbamazepine  ER 300mg  in AM, 600mg  in PM, and Levetiracetam  1500mg  BID (750mg : 2 tabs BID) without side effects. He has occasional pounding headaches where he lays in a dark room, no nausea/vomiting. He states headaches run in his family, his mother and grandfather have headaches. No dizziness. Sleep is good. He lives alone.    History On Initial Assessment 05/18/2022: This is a 48 year old right-handed man with a history of hypertension, co-existing intractable epilepsy s/p VNS implantation and non-epileptic events, presenting to establish care. Records were reviewed. He reports seizures started in childhood. Sometimes he gets a feeling and knows he needs to sit down, or his eyes start messing up, then he wakes up with injuries. He always bites inside of his gums on the left, last occurrence was a couple of weeks ago. He reports waking up with injuries on his back, left was a couple of weeks ago. He lives alone. No focal weakness, he feels drained and tired after. He has difficulty determining seizure frequency but states he does not have them like before, he thinks he had 4 seizures in the last 2 months. Seizure triggers include stress, getting worked up, heat. He denies any olfactory/gustatory hallucinations, deja vu, rising epigastric sensation, focal numbness/tingling/weakness,  myoclonic jerks. He has migraines once in a while with good response to Tylenol  or Ibuprofen. No nausea/vomiting, photo/phonophobia. There is a strong family history of migraines in his mother, maternal grandfather, and paternal grandmother). No dizziness, diplopia, dysarthria/dysphagia, neck pain. He has some back pain and occasional constipation. He gets around 12 hours of sleep. He lives alone. 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   Records from [facility] Neurology were reviewed, last visit with epileptologist Dr. Donnetta was in 03/2022. Per records, Zonisamide  was increased to 500mg  a day after level of 14 in 05/2021. Carbamazepine  level was 12.3, Levetiracetam  level 31.3. He is on Carbatrol  300mg  in AM, 600mg  in PM, Levetiracetam  1500mg  BID, and states he has been taking Zonisamide  400mg  qhs. VNS was placed in 2008, replaced in 2019. His last vEEG monitoring in 03/2005 showed interictal discharges in various regions throughout the right hemisphere as well as run of rhythmic activity in the right central temporal head region, which electrographically was at least suspicious for being electrographic seizure. In 1998, vEEG captured 14 events, 13 had no electrographic correlate and felt to be non-epileptic seizures. One electrographic seizure was seen with onset in the occipital head region and was consistent with a complex partial seizure with secondary generalization. 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   Epilepsy Risk Factors:  His paternal grandmother had blackouts but was not diagnosed with seizures. He was  in special education classes, finished 12th grade and did some college. There is no history of febrile convulsions, CNS infections such as meningitis/encephalitis, significant traumatic  brain injury, neurosurgical procedures.  Prior ASMs: Depakote, Diamox, Neurontin, Topamax, Dilantin, and Lamictal.  Diagnostic Data: MRI brain with and without contrast 03/2023: no acute changes, hippocampi symmetric with no abnormal signal or enhancement  vEEG monitoring in 03/2005 showed interictal discharges in various regions throughout the right hemisphere as well as run of rhythmic activity in the right central temporal head region, which electrographically was at least suspicious for being electrographic seizure. In 1998, vEEG captured 14 events, 13 had no electrographic correlate and felt to be non-epileptic seizures. One electrographic seizure was seen with onset in the occipital head region and was consistent with a complex partial seizure with secondary generalization.  1-hour EEG in 05/2022 showed occasional focal slowing over the right temporal region, frequent right frontotemporal epileptiform discharges   PAST MEDICAL HISTORY: Past Medical History:  Diagnosis Date   Hypertension    Seizures (HCC)     MEDICATIONS: Current Outpatient Medications on File Prior to Visit  Medication Sig Dispense Refill   acetaminophen  (TYLENOL ) 500 MG tablet Take 1 tablet (500 mg total) by mouth every 6 (six) hours as needed for mild pain or moderate pain. 30 tablet 0   amLODipine (NORVASC) 5 MG tablet Take 5 mg by mouth daily.     carbamazepine  (CARBATROL ) 300 MG 12 hr capsule Take 1 capsule in morning, 2 capsules in evening 90 capsule 11   Cenobamate  (XCOPRI ) 50 MG TABS Take 1 tablet every night 90 tablet 3   levETIRAcetam  (KEPPRA ) 750 MG tablet TAKE 2 TABLETS TWICE DAILY 360 tablet 3   multivitamin (ONE-A-DAY MEN'S) TABS tablet Take 1 tablet by mouth every morning.     zonisamide  (ZONEGRAN ) 100 MG capsule TAKE 5 CAPSULES EVERY NIGHT 450 capsule 3   psyllium (METAMUCIL SMOOTH TEXTURE) 58.6 % powder Take 1 packet by mouth 3 (three) times daily. 283 g 12   tiZANidine (ZANAFLEX) 4 MG tablet Take 4  mg by mouth 3 (three) times daily. (Patient not taking: Reported on 10/17/2023)     No current facility-administered medications on file prior to visit.    ALLERGIES: No Known Allergies  FAMILY HISTORY: Family History  Problem Relation Age of Onset   Colon cancer Neg Hx    Colon polyps Neg Hx    Esophageal cancer Neg Hx    Rectal cancer Neg Hx    Stomach cancer Neg Hx     SOCIAL HISTORY: Social History   Socioeconomic History   Marital status: Single    Spouse name: Not on file   Number of children: Not on file   Years of education: Not on file   Highest education level: Not on file  Occupational History   Not on file  Tobacco Use   Smoking status: Never   Smokeless tobacco: Never  Vaping Use   Vaping status: Never Used  Substance and Sexual Activity   Alcohol use: No   Drug use: Not Currently   Sexual activity: Not on file  Other Topics Concern   Not on file  Social History Narrative   Are you right handed or left handed? Right    Are you currently employed ? 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 yes   Who lives with you? NA   What type of home do you  live in: 1 story or 2 story? 1 story        Social Drivers of Corporate investment banker Strain: Low Risk  (03/17/2023)   Received from [facility]   Overall Financial Resource Strain (CARDIA)    Difficulty of Paying Living Expenses: Not very hard  Food Insecurity: Food Insecurity Present (03/17/2023)   Received from [facility]   Hunger Vital Sign    Within the past 12 months, you worried that your food would run out before you got the money to buy more.: Sometimes true    Within the past 12 months, the food you bought just didn't last and you didn't have money to get more.: Patient declined  Transportation Needs: Unknown (03/17/2023)   Received from [facility] - Transportation    Lack of Transportation (Medical): No    Lack of Transportation  (Non-Medical): Patient declined  Physical Activity: Unknown (03/17/2023)   Received from [facility]   Exercise Vital Sign    On average, how many days per week do you engage in moderate to strenuous exercise (like a brisk walk)?: 4 days    Minutes of Exercise per Session: Not on file  Stress: No Stress Concern Present (03/17/2023)   Received from [facility] of Occupational Health - Occupational Stress Questionnaire    Feeling of Stress : Not at all  Social Connections: Socially Integrated (03/17/2023)   Received [facility]   Social Network    How would you rate your social network (family, work, friends)?: Good participation with social networks  Intimate Partner Violence: Not At Risk (03/17/2023)   Received from [facility]   HITS    Over the last 12 months how often did your partner physically hurt you?: Never    Over the last 12 months how often did your partner insult you or talk down to you?: Never    Over the last 12 months how often did your partner threaten you with physical harm?: Never    Over the last 12 months how often did your partner scream or curse at you?: Never     PHYSICAL EXAM: Vitals:   10/17/23 1235  BP: (!) 149/96  Pulse: 80  SpO2: 98%   General: No acute distress Head:  Normocephalic/atraumatic Skin/Extremities: No rash, no edema Neurological Exam: alert and awake. No aphasia or dysarthria. Fund of knowledge is appropriate.  Attention and concentration are normal.   Cranial nerves: Pupils equal, round. Extraocular movements intact with no nystagmus. Visual fields full.  No facial asymmetry.  Motor: Bulk and tone normal, muscle strength 5/5 throughout with no pronator drift.   Finger to nose testing intact.  Gait narrow-based and steady, able to tandem walk adequately.  Romberg negative.  VNS Therapy Management: Parameters Output Current (mA): 0.625 Signal Frequency (Hz): 30 Pulse Width (usec): 250 Signal ON Time (sec):  30 Signal OFF Time (min): 3 Magnet Output Current (mA): 0.75 Magnet ON Time (sec): 60 Magnet Pulse Width (usec): 250 Diagnostics Current Delievered (mA): 0.5 Impedence Value (Ohms): 3160    IMPRESSION: This is a 49 yo RH man with a history of hypertension, co-existing intractable epilepsy s/p VNS implantation and non-epileptic events. Prior EEG reported interictal discharges in various regions throughout the right hemisphere as well as run of rhythmic activity in the right central temporal head region, most recent EEG showed frequent right frontotemporal epileptiform discharges. MRI brain normal. He reports 7-8 seizures in the past 3  months, he states he just ran out of Xcopri , however PDMP review indicates the last time prescription was sent was 6/25 (30 tablets). Prescription for Xcopri  50mg  at bedtime will be sent today, continue Zonisamide  500mg  at bedtime, carbamazepine  ER 300mg  1 cap in AM, 2 caps in PM and Levetiracetam  750mg  2 tabs BID. VNS interrogated today, output current increased to 0.625 mA which he tolerated without difficulties. He does not drive. Continue seizure calendar. Follow-up in 3 months, call for any changes.   Thank you for allowing me to participate in his care.  Please do not hesitate to call for any questions or concerns.    Darice Shivers, M.D.   CC: Dr. Pura

## 2023-10-17 NOTE — Patient Instructions (Signed)
 Good to see you.  Continue all your medications  2. Continue to keep a calendar of the seizures  3. Swipe your magnet twice a day  4. Follow-up in 3 months, call for any changes   Seizure Precautions: 1. If medication has been prescribed for you to prevent seizures, take it exactly as directed.  Do not stop taking the medicine without talking to your doctor first, even if you have not had a seizure in a long time.   2. Avoid activities in which a seizure would cause danger to yourself or to others.  Don't operate dangerous machinery, swim alone, or climb in high or dangerous places, such as on ladders, roofs, or girders.  Do not drive unless your doctor says you may.  3. If you have any warning that you may have a seizure, lay down in a safe place where you can't hurt yourself.    4.  No driving for 6 months from last seizure, as per [place]  state law.   Please refer to the following link on the [employer] of [employer]'s website for more information: http://www.epilepsyfoundation.org/answerplace/Social/driving/drivingu.cfm   5.  Maintain good sleep hygiene. Avoid alcohol  6.  Contact your doctor if you have any problems that may be related to the medicine you are taking.  7.  Call 911 and bring the patient back to the ED if:        A.  The seizure lasts longer than 5 minutes.       B.  The patient doesn't awaken shortly after the seizure  C.  The patient has new problems such as difficulty seeing, speaking or moving  D.  The patient was injured during the seizure  E.  The patient has a temperature over 102 F (39C)  F.  The patient vomited and now is having trouble breathing

## 2023-10-21 IMAGING — CR DG ELBOW COMPLETE 3+V*R*
4 series · 4 of 4 positions shown · non-contrast
Comparison: None Available.

CLINICAL DATA: Elbow pain

EXAM:
RIGHT ELBOW - COMPLETE 3+ VIEW

[elbow ap]
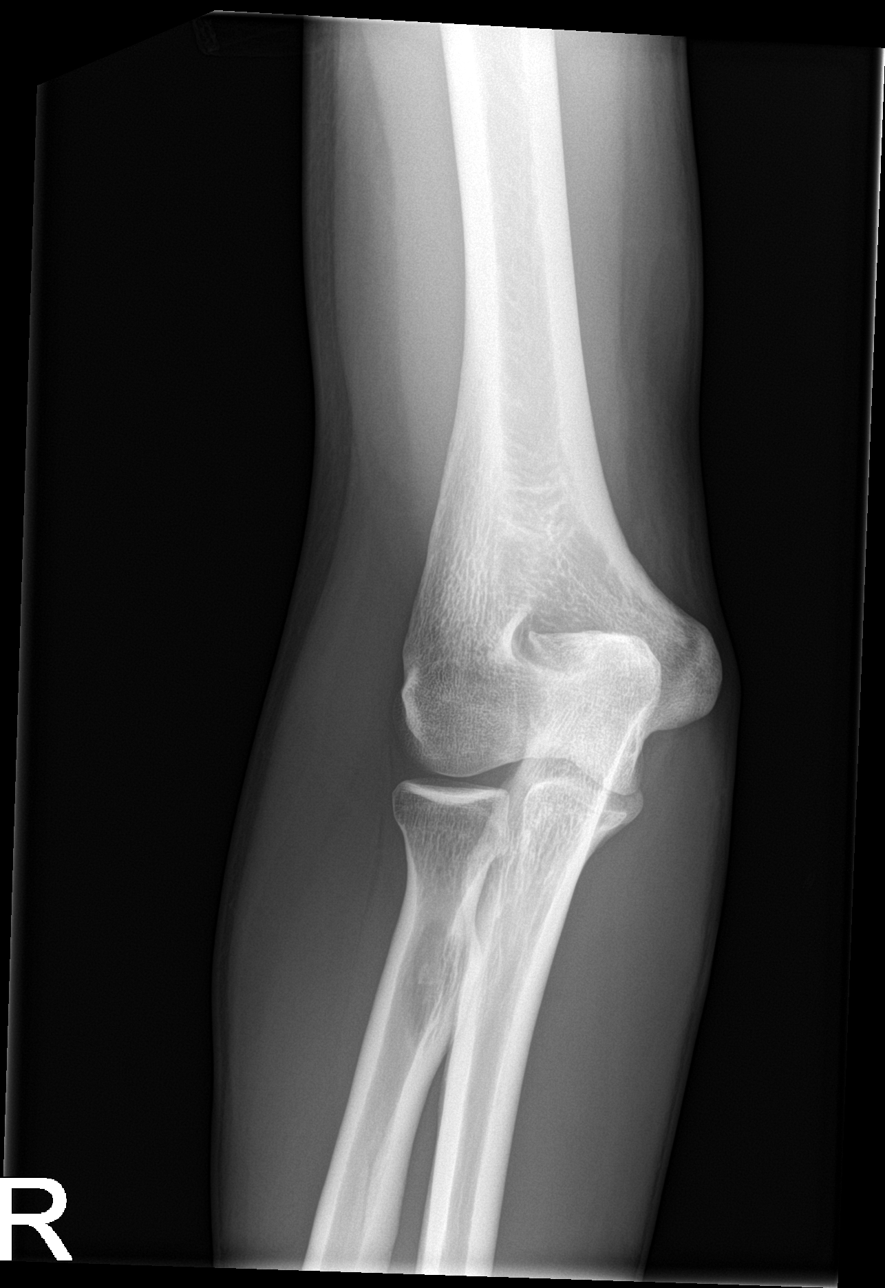

[elbow obl (1 of 2)]
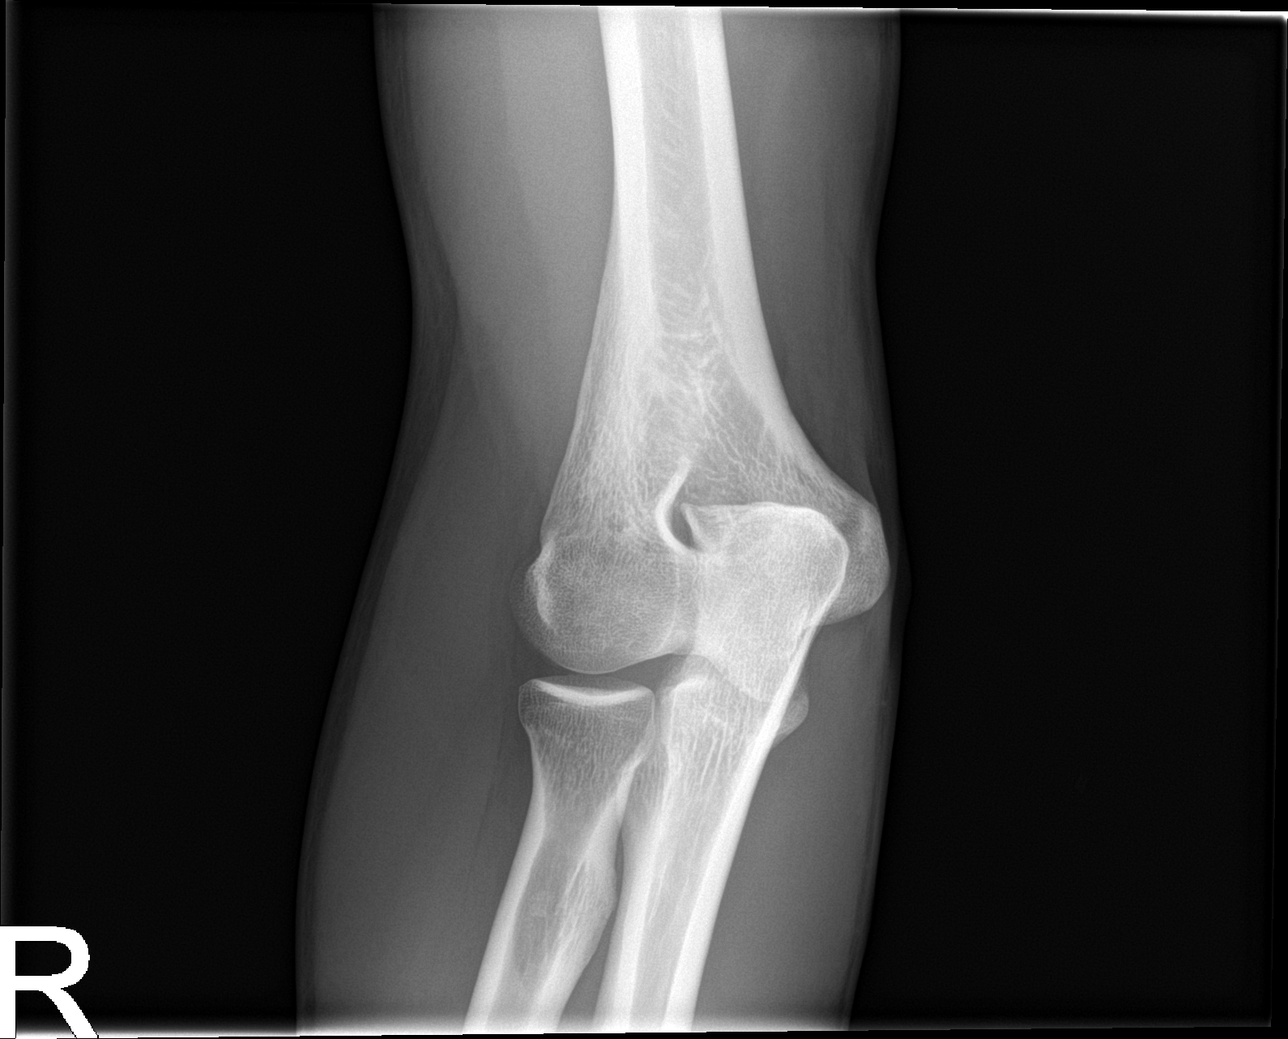

[elbow obl (2 of 2)]
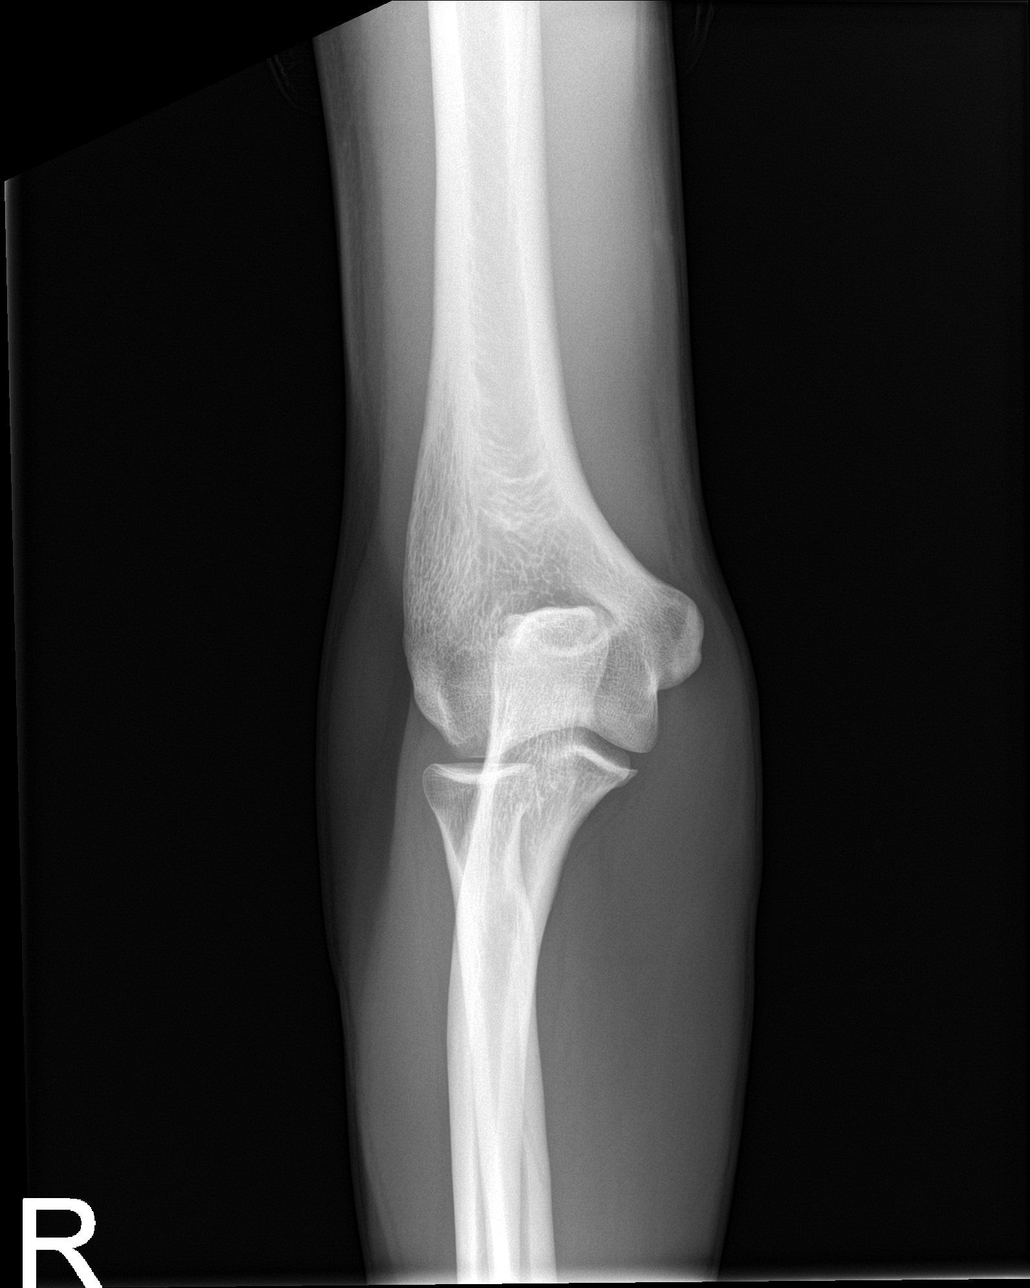

[elbow lat]
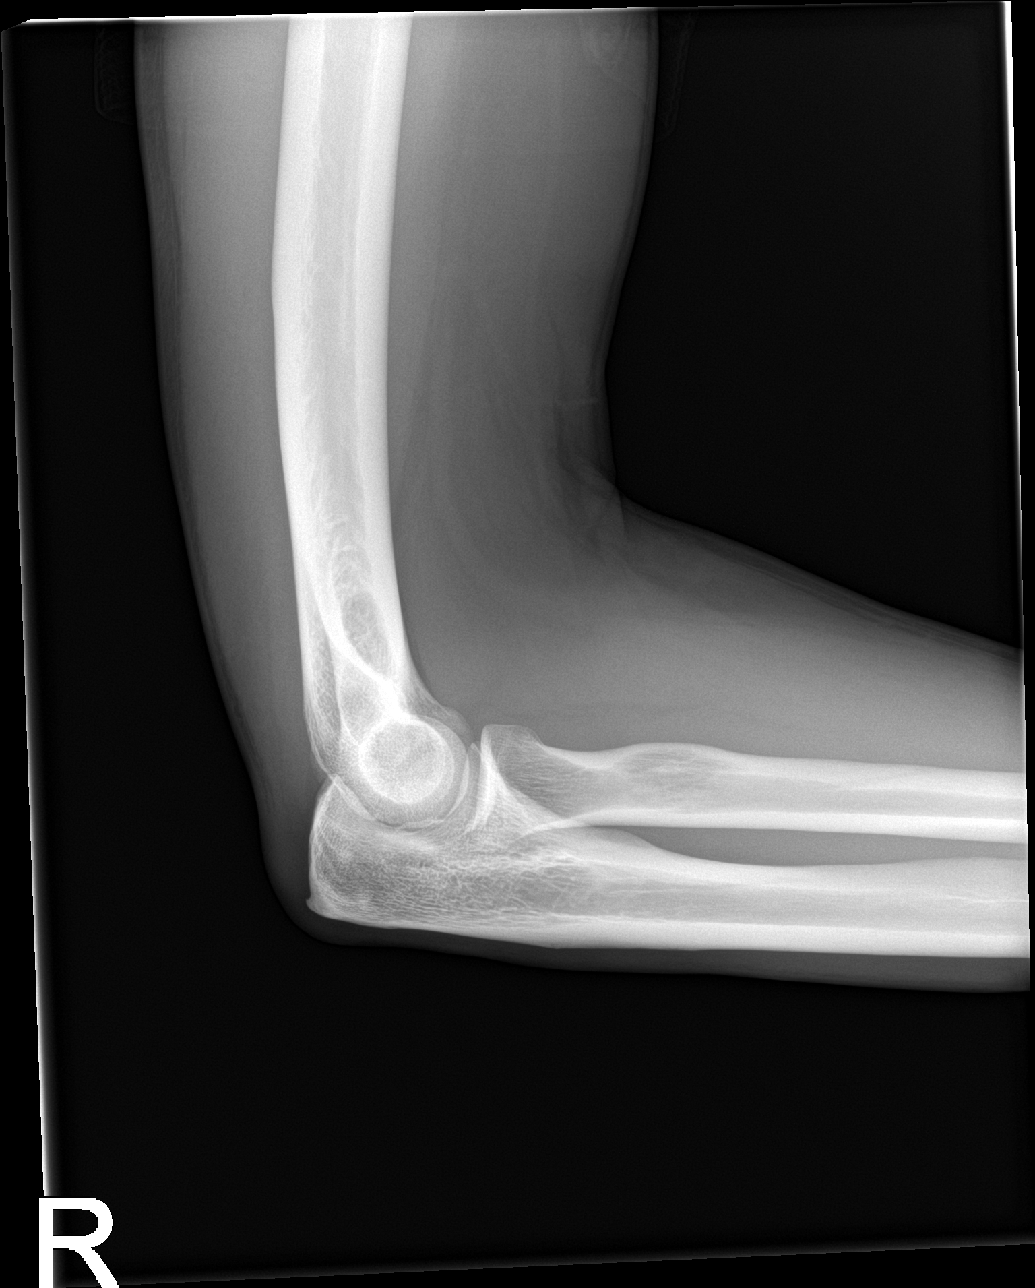

[4 of 4 positions shown; findings below may reference images not displayed]

FINDINGS: There is no joint effusion. No signs of acute fracture or
dislocation. No significant arthropathy. Soft tissues are
unremarkable.
IMPRESSION: Negative.

## 2023-11-01 ENCOUNTER — Telehealth: Payer: Self-pay | Admitting: Neurology

## 2023-11-01 ENCOUNTER — Emergency Department (HOSPITAL_COMMUNITY)
Admission: EM | Admit: 2023-11-01 | Discharge: 2023-11-01 | Disposition: A | Discharge status: Home / Self Care | Attending: Emergency Medicine | Admitting: Emergency Medicine

## 2023-11-01 ENCOUNTER — Other Ambulatory Visit: Payer: Self-pay

## 2023-11-01 ENCOUNTER — Encounter (HOSPITAL_COMMUNITY): Payer: Self-pay

## 2023-11-01 DIAGNOSIS — E860 Dehydration: Secondary | ICD-10-CM | POA: Diagnosis not present

## 2023-11-01 DIAGNOSIS — R42 Dizziness and giddiness: Secondary | ICD-10-CM | POA: Diagnosis present

## 2023-11-01 LAB — URINALYSIS, ROUTINE W REFLEX MICROSCOPIC
Bilirubin Urine: NEGATIVE
Glucose, UA: NEGATIVE mg/dL
Hgb urine dipstick: NEGATIVE
Ketones, ur: NEGATIVE mg/dL
Leukocytes,Ua: NEGATIVE
Nitrite: NEGATIVE
Protein, ur: NEGATIVE mg/dL
Specific Gravity, Urine: 1.019 (ref 1.005–1.030)
pH: 6 (ref 5.0–8.0)

## 2023-11-01 LAB — CBC
HCT: 45.4 % (ref 39.0–52.0)
Hemoglobin: 16 g/dL (ref 13.0–17.0)
MCH: 32 pg (ref 26.0–34.0)
MCHC: 35.2 g/dL (ref 30.0–36.0)
MCV: 90.8 fL (ref 80.0–100.0)
Platelets: 190 K/uL (ref 150–400)
RBC: 5 MIL/uL (ref 4.22–5.81)
RDW: 11.8 % (ref 11.5–15.5)
WBC: 4.5 K/uL (ref 4.0–10.5)
nRBC: 0 % (ref 0.0–0.2)

## 2023-11-01 LAB — COMPREHENSIVE METABOLIC PANEL WITH GFR
ALT: 20 U/L (ref 0–44)
AST: 18 U/L (ref 15–41)
Albumin: 4.8 g/dL (ref 3.5–5.0)
Alkaline Phosphatase: 66 U/L (ref 38–126)
Anion gap: 13 (ref 5–15)
BUN: 14 mg/dL (ref 6–20)
CO2: 24 mmol/L (ref 22–32)
Calcium: 9.6 mg/dL (ref 8.9–10.3)
Chloride: 101 mmol/L (ref 98–111)
Creatinine, Ser: 1.28 mg/dL — ABNORMAL HIGH (ref 0.61–1.24)
GFR, Estimated: >60 mL/min (ref >60)
Glucose, Bld: 104 mg/dL — ABNORMAL HIGH (ref 70–99)
Potassium: 3.9 mmol/L (ref 3.5–5.1)
Sodium: 138 mmol/L (ref 135–145)
Total Bilirubin: 0.9 mg/dL (ref 0.0–1.2)
Total Protein: 7.7 g/dL (ref 6.5–8.1)

## 2023-11-01 LAB — LIPASE, BLOOD: Lipase: 34 U/L (ref 11–51)

## 2023-11-01 NOTE — Telephone Encounter (Signed)
 Pt called he is in the ER at this time,

## 2023-11-01 NOTE — ED Triage Notes (Signed)
 C/O dizziness/blurred vision for 3 weeks ago. Denies headaches/ C/O abd pain on and off. Axox4. Denies numbness/tingling.

## 2023-11-01 NOTE — Telephone Encounter (Signed)
 Not sure if these are from the medication, he has been taking the Xcopri  for a while now. If this is the only new change (he has no fever, nausea/vomiting), he can cut the Xcopri  to 1/2 tablet and monitor if symptoms improve. If not, he may need to be evaluated in the ER

## 2023-11-01 NOTE — Discharge Instructions (Signed)
 You were seen in the emerged apartment for dizziness it has been going on and off for the last month as well as abdominal pain Your blood work showed what could be mild dehydration No other significant abnormalities Your EKG looked okay No recent seizures You need to continue taking all your previous prescribed seizure medications and other medications Follow-up with your primary doctor and Dr. Georjean within the next week to discuss today's visit and review your medication list Return to the Emergency Department for seizures, worsening of your dizziness or severe pain

## 2023-11-01 NOTE — ED Provider Notes (Signed)
 [place] EMERGENCY DEPARTMENT AT [facility] Provider Note   CSN: [id number] Arrival date & time: 11/01/23  1327     Patient presents with: Dizziness   Daniel Williams is a 48 y.o. male.  With a history of seizure disorder and status post VNS placement who presents to the ED for dizziness.  Patient reports episodic lightheadedness, dizziness for the last 3 weeks.  He also reports intermittent abdominal pain since this time.  No headaches focal weakness fevers chills neck pain nausea vomiting.  Recent adjustments to antiepileptic regimen were made by his neurologist Dr. Georjean.  He has been compliant with his Xcopri , carbamazepine  Keppra  and Zonegran  along with other prescribed medications.  Denies recent seizure activity.    Dizziness      Prior to Admission medications   Medication Sig Start Date End Date Taking? Authorizing Provider  acetaminophen  (TYLENOL ) 500 MG tablet Take 1 tablet (500 mg total) by mouth every 6 (six) hours as needed for mild pain or moderate pain. 07/22/15   Dansie, [name], [name]  amLODipine (NORVASC) 5 MG tablet Take 5 mg by mouth daily.    [provider]  carbamazepine  (CARBATROL ) 300 MG 12 hr capsule Take 1 capsule in morning, 2 capsules in evening 10/17/23   [name], MD  Cenobamate  (XCOPRI ) 50 MG TABS Take 1 tablet every night 10/17/23   [name], MD  levETIRAcetam  (KEPPRA ) 750 MG tablet Take 2 tablets (1,500 mg total) by mouth 2 (two) times daily. 10/17/23   [name], MD  multivitamin (ONE-A-DAY MEN'S) TABS tablet Take 1 tablet by mouth every morning.    [provider]  psyllium (METAMUCIL SMOOTH TEXTURE) 58.6 % powder Take 1 packet by mouth 3 (three) times daily. 03/23/18   [name], [name], [name]  tiZANidine (ZANAFLEX) 4 MG tablet Take 4 mg by mouth 3 (three) times daily. Patient not taking: Reported on 10/17/2023    [provider]  zonisamide  (ZONEGRAN ) 100 MG capsule Take 5 capsules every night  10/17/23   [name], MD    Allergies: Patient has no known allergies.    Review of Systems  Neurological:  Positive for dizziness.    Updated Vital Signs BP (!) 159/116   Pulse 75   Temp (!) 97.5 F (36.4 C)   Resp 18   Ht 5' 9 (1.753 m)   Wt 49.9 kg   SpO2 100%   BMI 16.24 kg/m   Physical Exam Vitals and nursing note reviewed.  HENT:     Head: Normocephalic and atraumatic.  Eyes:     Pupils: Pupils are equal, round, and reactive to light.  Cardiovascular:     Rate and Rhythm: Normal rate and regular rhythm.  Pulmonary:     Effort: Pulmonary effort is normal.     Breath sounds: Normal breath sounds.  Abdominal:     Palpations: Abdomen is soft.     Tenderness: There is no abdominal tenderness.  Skin:    General: Skin is warm and dry.  Neurological:     General: No focal deficit present.     Mental Status: He is alert and oriented to person, place, and time.     Sensory: No sensory deficit.     Motor: No weakness.  Psychiatric:        Mood and Affect: Mood normal.     (all labs ordered are listed, but only abnormal results are displayed) Labs Reviewed  COMPREHENSIVE METABOLIC PANEL WITH GFR -  Abnormal; Notable for the following components:      Result Value   Glucose, Bld 104 (*)    Creatinine, Ser 1.28 (*)    All other components within normal limits  CBC  LIPASE, BLOOD  URINALYSIS, ROUTINE W REFLEX MICROSCOPIC    EKG: EKG Interpretation Date/Time:  Thursday November 01 2023 13:50:04 EDT Ventricular Rate:  89 PR Interval:  132 QRS Duration:  86 QT Interval:  352 QTC Calculation: 428 R Axis:   93  Text Interpretation: Normal sinus rhythm Right atrial enlargement Rightward axis Pulmonary disease pattern Minimal voltage criteria for LVH, may be normal variant ( Sokolow-Lyon ) When compared with ECG of 11-Mar-2006 21:51, PREVIOUS ECG IS PRESENT Confirmed by Pamella Sharper [phone]) on 11/01/2023 4:06:04 PM  Radiology: No results  found.   Procedures   Medications Ordered in the ED - No data to display                                  Medical Decision Making 48 year old male with history as above presented to ED for 1 month of dizziness episodically.  Also reports intermittent abdominal pain.  Recent medication changes to antiepileptic regimen made by his neurologist.  No recent seizure activity.  No fevers chills.  Benign neurologic exam.  Afebrile slightly hypertensive.  No abdominal tenderness on my assessment.  Presentation concerning for potential electrolyte imbalance or other metabolic disturbance considering Xcopri  carbamazepine  Keppra  Zonegran .  Only slight elevation in creatinine.  No other significant derangement in electrolytes liver function test were evidenced on CBC.  EKG without evidence of dysrhythmia.  Unclear etiology but may be mildly dehydrated as he does look clinically dry.  Encouraged for adequate p.o. hydration at home.  He will follow-up with his PCP and neurologist within the next week.  Return precautions will be worrisome for any recurrent seizures or worsening of symptoms were discussed in detail.  Amount and/or Complexity of Data Reviewed Labs: ordered.        Final diagnoses:  Dizziness  Dehydration    ED Discharge Orders     None          Pamella Sharper LABOR, DO 11/01/23 1704

## 2023-11-01 NOTE — Telephone Encounter (Signed)
 Daniel Williams is in the ER being evaluated at this time,

## 2023-11-01 NOTE — Telephone Encounter (Signed)
 Pt is complaining of -can't see -Dizzy -Stomach Ache -can't walk at times Pt.s parents had to come to home and assist him he was doing so bad yesterday, Pt is still taking Rx prescribed and would like help with issues and to know if these are symptoms of Rx

## 2023-11-01 NOTE — Telephone Encounter (Signed)
 [facility] Call ID: [id number]  Who's calling (name and relationship to patient) : Daniel Williams; self  Best contact number: [phone]  Provider they see: Dr. Georjean  Reason for call: Caller states he is dizzy with double vision. Feels like his heart is racing. He is taking his medication as prescribed. BP 160/90. Has intermittent abd pain.    Call ID:      PRESCRIPTION REFILL ONLY  Name of prescription:  Pharmacy:

## 2023-11-02 ENCOUNTER — Emergency Department (HOSPITAL_COMMUNITY)

## 2023-11-02 ENCOUNTER — Observation Stay (HOSPITAL_COMMUNITY)
Admission: EM | Admit: 2023-11-02 | Discharge: 2023-11-04 | Disposition: A | Attending: Internal Medicine | Admitting: Internal Medicine

## 2023-11-02 ENCOUNTER — Encounter (HOSPITAL_COMMUNITY): Payer: Self-pay | Admitting: Internal Medicine

## 2023-11-02 ENCOUNTER — Other Ambulatory Visit: Payer: Self-pay

## 2023-11-02 DIAGNOSIS — E876 Hypokalemia: Secondary | ICD-10-CM | POA: Diagnosis not present

## 2023-11-02 DIAGNOSIS — Z79899 Other long term (current) drug therapy: Secondary | ICD-10-CM | POA: Diagnosis not present

## 2023-11-02 DIAGNOSIS — M545 Low back pain, unspecified: Secondary | ICD-10-CM | POA: Diagnosis not present

## 2023-11-02 DIAGNOSIS — R410 Disorientation, unspecified: Secondary | ICD-10-CM | POA: Diagnosis not present

## 2023-11-02 DIAGNOSIS — I10 Essential (primary) hypertension: Secondary | ICD-10-CM | POA: Diagnosis not present

## 2023-11-02 DIAGNOSIS — G8929 Other chronic pain: Secondary | ICD-10-CM | POA: Insufficient documentation

## 2023-11-02 DIAGNOSIS — K219 Gastro-esophageal reflux disease without esophagitis: Secondary | ICD-10-CM | POA: Diagnosis not present

## 2023-11-02 DIAGNOSIS — R569 Unspecified convulsions: Principal | ICD-10-CM | POA: Insufficient documentation

## 2023-11-02 DIAGNOSIS — R4182 Altered mental status, unspecified: Principal | ICD-10-CM

## 2023-11-02 DIAGNOSIS — J0130 Acute sphenoidal sinusitis, unspecified: Secondary | ICD-10-CM | POA: Insufficient documentation

## 2023-11-02 DIAGNOSIS — Z743 Need for continuous supervision: Secondary | ICD-10-CM | POA: Diagnosis not present

## 2023-11-02 LAB — URINE DRUG SCREEN
Amphetamines: NEGATIVE
Barbiturates: NEGATIVE
Benzodiazepines: NEGATIVE
Cocaine: NEGATIVE
Fentanyl: NEGATIVE
Methadone Scn, Ur: NEGATIVE
Opiates: NEGATIVE
Tetrahydrocannabinol: NEGATIVE

## 2023-11-02 LAB — CARBAMAZEPINE LEVEL, TOTAL
Carbamazepine Lvl: 19.2 ug/mL (ref 4.0–12.0)
Carbamazepine Lvl: 16.7 ug/mL (ref 4.0–12.0)

## 2023-11-02 LAB — CBC WITH DIFFERENTIAL/PLATELET
Abs Immature Granulocytes: 0.01 K/uL (ref 0.00–0.07)
Basophils Absolute: 0 K/uL (ref 0.0–0.1)
Basophils Relative: 0 %
Eosinophils Absolute: 0 K/uL (ref 0.0–0.5)
Eosinophils Relative: 1 %
HCT: 42.2 % (ref 39.0–52.0)
Hemoglobin: 14.3 g/dL (ref 13.0–17.0)
Immature Granulocytes: 0 %
Lymphocytes Relative: 39 %
Lymphs Abs: 1.8 K/uL (ref 0.7–4.0)
MCH: 31.4 pg (ref 26.0–34.0)
MCHC: 33.9 g/dL (ref 30.0–36.0)
MCV: 92.7 fL (ref 80.0–100.0)
Monocytes Absolute: 0.2 K/uL (ref 0.1–1.0)
Monocytes Relative: 4 %
Neutro Abs: 2.7 K/uL (ref 1.7–7.7)
Neutrophils Relative %: 56 %
Platelets: 182 K/uL (ref 150–400)
RBC: 4.55 MIL/uL (ref 4.22–5.81)
RDW: 11.8 % (ref 11.5–15.5)
WBC: 4.7 K/uL (ref 4.0–10.5)
nRBC: 0 % (ref 0.0–0.2)

## 2023-11-02 LAB — COMPREHENSIVE METABOLIC PANEL WITH GFR
ALT: 18 U/L (ref 0–44)
AST: 19 U/L (ref 15–41)
Albumin: 4.6 g/dL (ref 3.5–5.0)
Alkaline Phosphatase: 73 U/L (ref 38–126)
Anion gap: 12 (ref 5–15)
BUN: 14 mg/dL (ref 6–20)
CO2: 25 mmol/L (ref 22–32)
Calcium: 9 mg/dL (ref 8.9–10.3)
Chloride: 103 mmol/L (ref 98–111)
Creatinine, Ser: 1.11 mg/dL (ref 0.61–1.24)
GFR, Estimated: >60 mL/min (ref >60)
Glucose, Bld: 110 mg/dL — ABNORMAL HIGH (ref 70–99)
Potassium: 3.2 mmol/L — ABNORMAL LOW (ref 3.5–5.1)
Sodium: 140 mmol/L (ref 135–145)
Total Bilirubin: 0.3 mg/dL (ref 0.0–1.2)
Total Protein: 7.1 g/dL (ref 6.5–8.1)

## 2023-11-02 LAB — ETHANOL: Alcohol, Ethyl (B): <15 mg/dL (ref <15)

## 2023-11-02 LAB — MAGNESIUM: Magnesium: 2 mg/dL (ref 1.7–2.4)

## 2023-11-02 LAB — PHOSPHORUS: Phosphorus: 2.1 mg/dL — ABNORMAL LOW (ref 2.5–4.6)

## 2023-11-02 MED ORDER — CENOBAMATE 200 MG PO TABS: 50.0000 mg | ORAL_TABLET | Freq: Every day | ORAL | Status: DC

## 2023-11-02 MED ORDER — K PHOS MONO-SOD PHOS DI & MONO 155-852-130 MG PO TABS
500.0000 mg | ORAL_TABLET | Freq: Three times a day (TID) | ORAL | Status: AC
  Administered 2023-11-02 (×3): 500 mg via ORAL
  Filled 2023-11-02 (×5): qty 2

## 2023-11-02 MED ORDER — ONDANSETRON HCL 4 MG PO TABS: 4.0000 mg | ORAL_TABLET | Freq: Four times a day (QID) | ORAL | Status: DC | PRN

## 2023-11-02 MED ORDER — ZONISAMIDE 100 MG PO CAPS
500.0000 mg | ORAL_CAPSULE | Freq: Every day | ORAL | Status: DC
Start: 2023-11-02 — End: 2023-11-04
  Administered 2023-11-02 – 2023-11-03 (×2): 500 mg via ORAL
  Filled 2023-11-02 (×4): qty 5

## 2023-11-02 MED ORDER — ENOXAPARIN SODIUM 30 MG/0.3ML IJ SOSY
30.0000 mg | PREFILLED_SYRINGE | INTRAMUSCULAR | Status: DC
  Administered 2023-11-02: 30 mg via SUBCUTANEOUS
  Filled 2023-11-02: qty 0.3

## 2023-11-02 MED ORDER — LEVETIRACETAM 750 MG PO TABS
1500.0000 mg | ORAL_TABLET | Freq: Every day | ORAL | Status: DC
  Administered 2023-11-03 – 2023-11-04 (×2): 1500 mg via ORAL
  Filled 2023-11-02 (×2): qty 2

## 2023-11-02 MED ORDER — ACETAMINOPHEN 325 MG PO TABS
650.0000 mg | ORAL_TABLET | Freq: Four times a day (QID) | ORAL | Status: DC | PRN
  Administered 2023-11-02: 650 mg via ORAL
  Filled 2023-11-02 (×3): qty 2

## 2023-11-02 MED ORDER — ACETAMINOPHEN 650 MG RE SUPP: 650.0000 mg | Freq: Four times a day (QID) | RECTAL | Status: DC | PRN

## 2023-11-02 MED ORDER — POTASSIUM CHLORIDE CRYS ER 20 MEQ PO TBCR: 40.0000 meq | EXTENDED_RELEASE_TABLET | Freq: Once | ORAL | Status: DC

## 2023-11-02 MED ORDER — MAGNESIUM SULFATE 2 GM/50ML IV SOLN
2.0000 g | Freq: Once | INTRAVENOUS | Status: AC
  Administered 2023-11-02: 2 g via INTRAVENOUS
  Filled 2023-11-02: qty 50

## 2023-11-02 MED ORDER — ONDANSETRON HCL 4 MG/2ML IJ SOLN: 4.0000 mg | Freq: Four times a day (QID) | INTRAMUSCULAR | Status: DC | PRN

## 2023-11-02 MED ORDER — LEVETIRACETAM 500 MG PO TABS
1500.0000 mg | ORAL_TABLET | Freq: Two times a day (BID) | ORAL | Status: DC
  Administered 2023-11-02: 1500 mg via ORAL
  Filled 2023-11-02: qty 3

## 2023-11-02 MED ORDER — AMLODIPINE BESYLATE 5 MG PO TABS
5.0000 mg | ORAL_TABLET | Freq: Every day | ORAL | Status: DC
  Administered 2023-11-02 – 2023-11-04 (×3): 5 mg via ORAL
  Filled 2023-11-02 (×3): qty 1

## 2023-11-02 NOTE — ED Provider Notes (Signed)
 Physical Exam  BP (!) 146/111   Pulse 85   Temp (!) 97.5 F (36.4 C) (Rectal)   Resp 12   Ht 5' 9 (1.753 m)   Wt 49.9 kg   SpO2 100%   BMI 16.24 kg/m   Physical Exam Vitals and nursing note reviewed.  Constitutional:      General: He is not in acute distress.    Appearance: He is not toxic-appearing.  HENT:     Mouth/Throat:     Comments: Orthodontic braces present Skin:    General: Skin is warm and dry.  Neurological:     Mental Status: He is alert.  Psychiatric:     Comments: Hyper focused speech, normal phonation     Procedures  Procedures  ED Course / MDM    Medical Decision Making Amount and/or Complexity of Data Reviewed Labs: ordered. Radiology: ordered. ECG/medicine tests: ordered.  Risk Prescription drug management.   Accepted handoff at shift change from [employer]. Please see prior provider note for more detail.   Briefly: Patient is 48 y.o. M presenting here for possible seizure. Plan is for further observation for off behavior. If baseline, can follow up with outpatient neuro after pausing  Carbamazepine  for a few days. If still off, will need transfer to [facility] for EEG given history. Please see neurology note for additional information.   On my initial evaluation, the patient has been sleeping in no acute distress. He was easily awakened. He has continuous speech and moving extremities. He reports that he remembers last night, however wasn't able to recall what happened. He is hyper-focused on religion and speaks about God in his dream and nightmares and needing to study and serve. Still having some odd verbiage. No one is at bedside for additional history. Will have patient transferred over to [facility] for admission for EEG. Dr. Celinda to admit.   Results for orders placed or performed during the hospital encounter of 11/02/23  CBC with Differential   Collection Time: 11/02/23  2:32 AM  Result Value Ref Range   WBC 4.7 4.0 - 10.5 K/uL   RBC  4.55 4.22 - 5.81 MIL/uL   Hemoglobin 14.3 13.0 - 17.0 g/dL   HCT 57.7 60.9 - 47.9 %   MCV 92.7 80.0 - 100.0 fL   MCH 31.4 26.0 - 34.0 pg   MCHC 33.9 30.0 - 36.0 g/dL   RDW 88.1 88.4 - 84.4 %   Platelets 182 150 - 400 K/uL   nRBC 0.0 0.0 - 0.2 %   Neutrophils Relative % 56 %   Neutro Abs 2.7 1.7 - 7.7 K/uL   Lymphocytes Relative 39 %   Lymphs Abs 1.8 0.7 - 4.0 K/uL   Monocytes Relative 4 %   Monocytes Absolute 0.2 0.1 - 1.0 K/uL   Eosinophils Relative 1 %   Eosinophils Absolute 0.0 0.0 - 0.5 K/uL   Basophils Relative 0 %   Basophils Absolute 0.0 0.0 - 0.1 K/uL   Immature Granulocytes 0 %   Abs Immature Granulocytes 0.01 0.00 - 0.07 K/uL  Comprehensive metabolic panel   Collection Time: 11/02/23  2:32 AM  Result Value Ref Range   Sodium 140 135 - 145 mmol/L   Potassium 3.2 (L) 3.5 - 5.1 mmol/L   Chloride 103 98 - 111 mmol/L   CO2 25 22 - 32 mmol/L   Glucose, Bld 110 (H) 70 - 99 mg/dL   BUN 14 6 - 20 mg/dL   Creatinine, Ser 8.88 0.61 -  1.24 mg/dL   Calcium 9.0 8.9 - 89.6 mg/dL   Total Protein 7.1 6.5 - 8.1 g/dL   Albumin 4.6 3.5 - 5.0 g/dL   AST 19 15 - 41 U/L   ALT 18 0 - 44 U/L   Alkaline Phosphatase 73 38 - 126 U/L   Total Bilirubin 0.3 0.0 - 1.2 mg/dL   GFR, Estimated >39 >39 mL/min   Anion gap 12 5 - 15  Ethanol   Collection Time: 11/02/23  2:32 AM  Result Value Ref Range   Alcohol, Ethyl (B) <15 <15 mg/dL  Carbamazepine  level, total   Collection Time: 11/02/23  2:59 AM  Result Value Ref Range   Carbamazepine  Lvl 19.2 (HH) 4.0 - 12.0 ug/mL   CT Head Wo Contrast Result Date: 11/02/2023 EXAM: CT HEAD WITHOUT CONTRAST 11/02/2023 04:01:24 AM TECHNIQUE: CT of the head was performed without the administration of intravenous contrast. Automated exposure control, iterative reconstruction, and/or weight based adjustment of the mA/kV was utilized to reduce the radiation dose to as low as reasonably achievable. COMPARISON: MRI of the head dated 04/04/2023. CLINICAL HISTORY:  Seizure disorder, clinical change; seizure, has VNS stimulator in place. seizures and dizziness. Neighbors called EMS as patient was yelling outside of their home, unsure of how he got there. Upon EMS arrival pt very confused (postictal). Notes from chart; Hx of seizures, on medication, has VNS stimulator. FINDINGS: BRAIN AND VENTRICLES: No acute hemorrhage. No evidence of acute infarct. No hydrocephalus. No extra-axial collection. No mass effect or midline shift. ORBITS: No acute abnormality. SINUSES: There is mild polypoid mucosal disease present within the left sphenoid sinus and posterior ethmoid air cell. SOFT TISSUES AND SKULL: No acute soft tissue abnormality. No skull fracture. IMPRESSION: 1. No acute intracranial abnormality. 2. Mild polypoid mucosal disease in the left sphenoid sinus and posterior ethmoid air cells. Electronically signed by: Evalene Coho MD 11/02/2023 04:22 AM EDT RP Workstation: [id number]   DG Chest 1 View Result Date: 11/02/2023 EXAM: 1 VIEW(S) XRAY OF THE CHEST 11/02/2023 02:49:00 AM COMPARISON: 02/19/2008 CLINICAL HISTORY: seizure, VNS stimulator in place. Seizures FINDINGS: LINES, TUBES AND DEVICES: Vagal nerve stimulator on left chest. LUNGS AND PLEURA: No focal pulmonary opacity. No pulmonary edema. No pleural effusion. No pneumothorax. HEART AND MEDIASTINUM: No acute abnormality of the cardiac and mediastinal silhouettes. BONES AND SOFT TISSUES: No acute osseous abnormality. IMPRESSION: 1. No acute cardiopulmonary process. Electronically signed by: Pinkie Pebbles MD 11/02/2023 02:52 AM EDT RP Workstation: [id number]          Bernis Ernst, [name] 11/02/23 9053    Rogelia Jerilynn RAMAN, MD 11/02/23 1336

## 2023-11-02 NOTE — Progress Notes (Signed)
 [facility] admitting physician addendum:  The patient has only been taking Keppra  1500 mg daily once instead of twice daily.  Also, I was notified by pharmacy that they do not have cenobamate  in the hospital and he may need to have somebody bring his own supply from home.  Will related the neurohospitalist team made any new adjustments to the levetiracetam  dosing.  Alm Castor, MD.

## 2023-11-02 NOTE — ED Notes (Signed)
[facility] has been called for transport.  

## 2023-11-02 NOTE — H&P (Signed)
 History and Physical    Patient: Daniel Williams FMW:[id number] DOB: 1975/09/08 DOA: 11/02/2023 DOS: the patient was seen and examined on 11/02/2023 PCP: [name], MD  Patient coming from: Home  Chief Complaint:  Chief Complaint  Patient presents with   Seizures   HPI: Daniel Williams is a 48 y.o. male with medical history significant of hypertension, chronic lower back pain, partial epilepsy, history of psychogenic seizures, status post VNS stimulator who was brought to the emergency department via EMS due to seizures and dizziness.  EMS was called by his neighbors who saw him yelling outside.  EMS reported that he was confused when they arrived.  The patient states that he feels like he is not having his seizures, but is having some weird dreams instead. He denied fever, chills, rhinorrhea, sore throat, wheezing or hemoptysis.  No chest pain, palpitations, diaphoresis, PND, orthopnea or pitting edema of the lower extremities.  No abdominal pain, nausea, emesis, diarrhea, constipation, melena or hematochezia.  No flank pain, dysuria, frequency or hematuria.  No polyuria, polydipsia, polyphagia or blurred vision.   Lab work: CBC was normal.  CMP showed a potassium of 3.2 mmol/L and a glucose of 110 mg/dL, but was otherwise unremarkable.  Magnesium 2.0, phosphorus 2.1 and alcohol less than 15 mg/dL.  Carbamazepine  level was 19.2 mcg/mL.  Imaging: Portable 1 view chest radiograph with no active cardiopulmonary process.  CT head without contrast with no acute intracranial normality.  There is mild polypoid mucosal disease in the left sphenoid sinus and posterior ethmoid cells.  ED course: Initial vital signs were temperature 96.3 F, pulse 87, respirations 22, BP 171/109 mmHg and O2 sat 100% on room air.  The patient was ordered KCl 40 mEq p.o. x 1 dose in the ED.   Review of Systems: As mentioned in the history of present illness. All other systems reviewed and are negative. Past  Medical History:  Diagnosis Date   Hypertension    Seizures (HCC)    Past Surgical History:  Procedure Laterality Date   vns stimulator     Social History:  reports that he has never smoked. He has never used smokeless tobacco. He reports that he does not currently use drugs. He reports that he does not drink alcohol.  No Known Allergies  Family History  Problem Relation Age of Onset   Colon cancer Neg Hx    Colon polyps Neg Hx    Esophageal cancer Neg Hx    Rectal cancer Neg Hx    Stomach cancer Neg Hx     Prior to Admission medications   Medication Sig Start Date End Date Taking? Authorizing Provider  acetaminophen  (TYLENOL ) 500 MG tablet Take 1 tablet (500 mg total) by mouth every 6 (six) hours as needed for mild pain or moderate pain. 07/22/15   Dansie, [name], [name]  amLODipine (NORVASC) 5 MG tablet Take 5 mg by mouth daily.    [provider]  carbamazepine  (CARBATROL ) 300 MG 12 hr capsule Take 1 capsule in morning, 2 capsules in evening 10/17/23   [name], MD  Cenobamate  (XCOPRI ) 50 MG TABS Take 1 tablet every night 10/17/23   [name], [name], MD  levETIRAcetam  (KEPPRA ) 750 MG tablet Take 2 tablets (1,500 mg total) by mouth 2 (two) times daily. 10/17/23   [name], MD  multivitamin (ONE-A-DAY MEN'S) TABS tablet Take 1 tablet by mouth every morning.    [provider]  psyllium (METAMUCIL SMOOTH TEXTURE) 58.6 % powder  Take 1 packet by mouth 3 (three) times daily. 03/23/18   [name], [name], [name]  tiZANidine (ZANAFLEX) 4 MG tablet Take 4 mg by mouth 3 (three) times daily. Patient not taking: Reported on 10/17/2023    [provider]  zonisamide  (ZONEGRAN ) 100 MG capsule Take 5 capsules every night 10/17/23   [name], MD    Physical Exam: Vitals:   11/02/23 0219 11/02/23 0409 11/02/23 0630 11/02/23 0902  BP:  (!) 154/111 (!) 146/111   Pulse:  90 85   Resp:  (!) 22 12   Temp:    (!) 97.5 F (36.4 C)  TempSrc:    Rectal  SpO2:   99% 100%   Weight: 49.9 kg     Height: 5' 9 (1.753 m)      Physical Exam Vitals and nursing note reviewed.  Constitutional:      General: He is awake. He is not in acute distress.    Appearance: Normal appearance. He is ill-appearing.  HENT:     Head: Normocephalic.     Nose: No rhinorrhea.     Mouth/Throat:     Mouth: Mucous membranes are moist.  Eyes:     General: No scleral icterus.    Pupils: Pupils are equal, round, and reactive to light.  Neck:     Vascular: No JVD.  Cardiovascular:     Rate and Rhythm: Normal rate and regular rhythm.     Heart sounds: S1 normal and S2 normal.  Pulmonary:     Breath sounds: No wheezing, rhonchi or rales.  Abdominal:     General: Bowel sounds are normal. There is no distension.     Palpations: Abdomen is soft.     Tenderness: There is no abdominal tenderness. There is no right CVA tenderness or left CVA tenderness.  Musculoskeletal:     Cervical back: Neck supple.     Right lower leg: No edema.     Left lower leg: No edema.  Skin:    General: Skin is warm and dry.  Neurological:     General: No focal deficit present.     Mental Status: He is alert and oriented to person, place, and time.  Psychiatric:        Mood and Affect: Mood normal.        Behavior: Behavior normal. Behavior is cooperative.     Data Reviewed:  Results are pending, will review when available. EKG: Vent. rate 89 BPM PR interval 132 ms QRS duration 86 ms QT/QTcB 352/428 ms P-R-T axes 91 93 81 Normal sinus rhythm Right atrial enlargement Rightward axis Pulmonary disease pattern Minimal voltage criteria for LVH, may be normal variant ( Sokolow-Lyon )  EKG: Vent. rate 85 BPM PR interval 140 ms QRS duration 83 ms QT/QTcB 370/440 ms P-R-T axes 82 85 66 Sinus rhythm ST elevation suggests acute pericarditis  Assessment and Plan: Principal Problem:   Seizures (HCC) Admit to PCU/inpatient. Continue levetiracetam  1500 mg p.o. twice daily. Continue  Cinoman 50 mg p.o. bedtime. Continuous zonisamide  500 mg p.o. at bedtime. Hold carbamazepine  and follow level. Follow-up CBC and chemistry in the morning. The neurohospitalist team will evaluate once [facility].  Active Problems:   Hypertension Continue amlodipine 5 mg p.o. daily. Monitor blood pressure.    Hypokalemia Replacement ordered. Magnesium was supplemented. Follow-up potassium level in AM.    Hypophosphatemia Neutra-Phos 500 mg p.o. 3 times daily x 3 doses. Follow-up phosphorus level as needed.    Advance Care Planning:  Code Status: Full Code   Consults:   Family Communication:   Severity of Illness: The appropriate patient status for this patient is OBSERVATION. Observation status is judged to be reasonable and necessary in order to provide the required intensity of service to ensure the patient's safety. The patient's presenting symptoms, physical exam findings, and initial radiographic and laboratory data in the context of their medical condition is felt to place them at decreased risk for further clinical deterioration. Furthermore, it is anticipated that the patient will be medically stable for discharge from the hospital within 2 midnights of admission.   Author: Alm Dorn Castor, MD 11/02/2023 9:21 AM  For on call review [email or web address].   This document was prepared using Dragon voice recognition software and may contain some unintended transcription errors.

## 2023-11-02 NOTE — Plan of Care (Signed)
 Oncology note  Called by ED provider at [facility] regarding patient with history of medically refractory epilepsy status post VNS.  On 4 antiepileptics.  Brought in for concern for agitation dizziness and seizures.  Still appears to be somewhat off of baseline. Carbamazepine  level high at 19. Keppra  level pending Other AEDs: Cenobamate  and zonisamide . At this time, recommendations based on phone conversation as below: - Observe in the emergency room for another 3 to 4 hours - Hold carbamazepine  for now. - Continue the other 3 antiepileptics - If he is back to his baseline in the next few hours, he can be discharged home with outpatient follow-up with his outpatient neurology provider Dr. Georjean. - If he is not back to baseline in the next 3 to 4 hours, transferred to [facility] because he may need in person neurology consultation as well as LTM EEG.  This was discussed with Olam Slocumb, [name], EDP at [facility] over the phone   Eligio Lav, MD Neurology

## 2023-11-02 NOTE — ED Triage Notes (Signed)
 PT BIB EMS coming from home c/o of seizures and dizziness. Neighbors called EMS as patient was yelling outside of their home, unsure of how he got there. Upon EMS arrival pt very confused (postictal). Pt alert and oriented during triage. Hx: seizures- on keppra  (compliant). Seen for dizziness yesterday at [facility].

## 2023-11-02 NOTE — ED Notes (Signed)
 Pt returned from CT slightly agitated with tremors and tachycardiac. Provider called in to assess pt. Provider Olam stated pt is at baseline from per triage note and initial assess upon arrival. Pt is tearful and has repetitively stated  im sorry, I am going to see glory.

## 2023-11-02 NOTE — ED Notes (Addendum)
 This Nurse attempted to give pt oral meds, potassium chloride , pt refused.

## 2023-11-02 NOTE — Plan of Care (Signed)

## 2023-11-02 NOTE — ED Notes (Signed)
 Pt has urine sample sent to lab

## 2023-11-02 NOTE — Progress Notes (Signed)
 Pt arrived in room. A&Ox4. VSS. Tele on, call light in reach.

## 2023-11-02 NOTE — ED Provider Notes (Signed)
 [place] EMERGENCY DEPARTMENT AT [facility] Provider Note   CSN: [id number] Arrival date & time: 11/02/23  0208     Patient presents with: Seizures   Daniel Williams is a 48 y.o. male.   The history is provided by the patient and medical records.  Seizures  48 y.o. M with history of hypertension, refractory seizures status post VNS stimulator, presenting to the ED after reported seizure.  Patient seen in the ED yesterday for dizziness with reassuring workup.  States generally he just does not feel well.  He thought he was dreaming that he was having seizures and woke up to paramedics around him.  Neighbors had apparently called EMS because he was outside yelling.  No incontinence, not sure if he bit his tongue or not.  He reports compliance with his medications.  Seen in ER yesterday and told it was low water but states he just does not feel well.  Prior to Admission medications   Medication Sig Start Date End Date Taking? Authorizing Provider  acetaminophen  (TYLENOL ) 500 MG tablet Take 1 tablet (500 mg total) by mouth every 6 (six) hours as needed for mild pain or moderate pain. 07/22/15   Dansie, [name], [name]  amLODipine (NORVASC) 5 MG tablet Take 5 mg by mouth daily.    [provider]  carbamazepine  (CARBATROL ) 300 MG 12 hr capsule Take 1 capsule in morning, 2 capsules in evening 10/17/23   [name], MD  Cenobamate  (XCOPRI ) 50 MG TABS Take 1 tablet every night 10/17/23   [name], MD  levETIRAcetam  (KEPPRA ) 750 MG tablet Take 2 tablets (1,500 mg total) by mouth 2 (two) times daily. 10/17/23   [name], MD  multivitamin (ONE-A-DAY MEN'S) TABS tablet Take 1 tablet by mouth every morning.    [provider]  psyllium (METAMUCIL SMOOTH TEXTURE) 58.6 % powder Take 1 packet by mouth 3 (three) times daily. 03/23/18   [name], [name], [name]  tiZANidine (ZANAFLEX) 4 MG tablet Take 4 mg by mouth 3 (three) times daily. Patient not taking:  Reported on 10/17/2023    [provider]  zonisamide  (ZONEGRAN ) 100 MG capsule Take 5 capsules every night 10/17/23   [name], MD    Allergies: Patient has no known allergies.    Review of Systems  Neurological:  Positive for seizures.  All other systems reviewed and are negative.   Updated Vital Signs BP (!) 171/109 (BP Location: Left Arm)   Pulse 87   Temp (!) 96.3 F (35.7 C) (Oral)   Resp (!) 22   Ht 5' 9 (1.753 m)   Wt 49.9 kg   SpO2 100%   BMI 16.24 kg/m   Physical Exam Vitals and nursing note reviewed.  Constitutional:      Appearance: He is well-developed.  HENT:     Head: Normocephalic and atraumatic.     Mouth/Throat:     Comments: Bite mark noted to right side of tongue, no active bleeding, dentition intact Eyes:     Conjunctiva/sclera: Conjunctivae normal.     Pupils: Pupils are equal, round, and reactive to light.  Cardiovascular:     Rate and Rhythm: Normal rate and regular rhythm.     Heart sounds: Normal heart sounds.  Pulmonary:     Effort: Pulmonary effort is normal.     Breath sounds: Normal breath sounds.  Abdominal:     General: Bowel sounds are normal.     Palpations: Abdomen is soft.  Musculoskeletal:        General: Normal range of motion.     Cervical back: Normal range of motion.  Skin:    General: Skin is warm and dry.  Neurological:     Mental Status: He is alert and oriented to person, place, and time.     (all labs ordered are listed, but only abnormal results are displayed) Labs Reviewed  COMPREHENSIVE METABOLIC PANEL WITH GFR - Abnormal; Notable for the following components:      Result Value   Potassium 3.2 (*)    Glucose, Bld 110 (*)    All other components within normal limits  CBC WITH DIFFERENTIAL/PLATELET  LEVETIRACETAM  LEVEL  CARBAMAZEPINE  LEVEL, TOTAL  ETHANOL  URINE DRUG SCREEN    EKG: None  Radiology: DG Chest 1 View Result Date: 11/02/2023 EXAM: 1 VIEW(S) XRAY OF THE CHEST 11/02/2023  02:49:00 AM COMPARISON: 02/19/2008 CLINICAL HISTORY: seizure, VNS stimulator in place. Seizures FINDINGS: LINES, TUBES AND DEVICES: Vagal nerve stimulator on left chest. LUNGS AND PLEURA: No focal pulmonary opacity. No pulmonary edema. No pleural effusion. No pneumothorax. HEART AND MEDIASTINUM: No acute abnormality of the cardiac and mediastinal silhouettes. BONES AND SOFT TISSUES: No acute osseous abnormality. IMPRESSION: 1. No acute cardiopulmonary process. Electronically signed by: Pinkie Pebbles MD 11/02/2023 02:52 AM EDT RP Workstation: [id number]     Procedures   Medications Ordered in the ED - No data to display                                  Medical Decision Making Amount and/or Complexity of Data Reviewed Labs: ordered. Radiology: ordered and independent interpretation performed. ECG/medicine tests: ordered and independent interpretation performed.  Risk Prescription drug management.   48 y.o. M here after questionable seizure.  He reports he was dreaming that he was having seizures but apparently EMS was called by neighbor as he was outside yelling in their yard.  Patient does not have recollection of this.  He is awake/alert on arrival.  Does have bite mark along right side of his tongue without active bleeding, unclear how acute this is.  No does not have any visible head trauma.  Does have VNS in place.  Will obtain EKG, labs, CT head, CXR.    4:08 AM Called back into room due to behavior change.  Patient swinging his arms around and continuing to repeat thank you lord, thank you lord.  He does respond when spoken to.  Does not have any ticking of the eyes, smacking of the lips, etc. Does not appear to be acutely seizing.  Awaiting head CT read.  Labs overall reassuring, mild hypokalemia at 3.2.  Will add on UDS and ethanol level to ensure this is not substance induced.  5:26 AM Patient's carbamazepine  level is 19.2.  This could account for his altered behaviors and  dizziness he has been reporting.  I discussed case with on-call neurology, Dr. Arora-- would recommend to hold his carbamazepine  for a few days.  Recommended to watch here in the ER for another 3 to 4 hours (until around 8am) to see if he returns to baseline.  If so, stable for discharge but if not, he will need transfer to [facility] and likely EEG at that point.  Care will be signed out to oncoming provider pending reassessment.    Final diagnoses:  Altered mental status, unspecified altered mental status type    ED Discharge  Orders     None          Jarold Olam HERO, [name] 11/02/23 [id number]    Trine Raynell Moder, MD 11/02/23 [phone]

## 2023-11-03 DIAGNOSIS — E876 Hypokalemia: Secondary | ICD-10-CM | POA: Diagnosis not present

## 2023-11-03 DIAGNOSIS — I10 Essential (primary) hypertension: Secondary | ICD-10-CM | POA: Diagnosis not present

## 2023-11-03 DIAGNOSIS — R569 Unspecified convulsions: Secondary | ICD-10-CM | POA: Diagnosis not present

## 2023-11-03 LAB — CBC
HCT: 40.1 % (ref 39.0–52.0)
Hemoglobin: 14.1 g/dL (ref 13.0–17.0)
MCH: 31.7 pg (ref 26.0–34.0)
MCHC: 35.2 g/dL (ref 30.0–36.0)
MCV: 90.1 fL (ref 80.0–100.0)
Platelets: 187 K/uL (ref 150–400)
RBC: 4.45 MIL/uL (ref 4.22–5.81)
RDW: 11.7 % (ref 11.5–15.5)
WBC: 3.5 K/uL — ABNORMAL LOW (ref 4.0–10.5)
nRBC: 0 % (ref 0.0–0.2)

## 2023-11-03 LAB — COMPREHENSIVE METABOLIC PANEL WITH GFR
ALT: 15 U/L (ref 0–44)
AST: 12 U/L — ABNORMAL LOW (ref 15–41)
Albumin: 3.9 g/dL (ref 3.5–5.0)
Alkaline Phosphatase: 59 U/L (ref 38–126)
Anion gap: 11 (ref 5–15)
BUN: 11 mg/dL (ref 6–20)
CO2: 26 mmol/L (ref 22–32)
Calcium: 9.4 mg/dL (ref 8.9–10.3)
Chloride: 103 mmol/L (ref 98–111)
Creatinine, Ser: 1.15 mg/dL (ref 0.61–1.24)
GFR, Estimated: >60 mL/min (ref >60)
Glucose, Bld: 104 mg/dL — ABNORMAL HIGH (ref 70–99)
Potassium: 3.5 mmol/L (ref 3.5–5.1)
Sodium: 140 mmol/L (ref 135–145)
Total Bilirubin: 0.7 mg/dL (ref 0.0–1.2)
Total Protein: 6.9 g/dL (ref 6.5–8.1)

## 2023-11-03 LAB — HIV ANTIBODY (ROUTINE TESTING W REFLEX): HIV Screen 4th Generation wRfx: NONREACTIVE

## 2023-11-03 MED ORDER — ENOXAPARIN SODIUM 40 MG/0.4ML IJ SOSY
40.0000 mg | PREFILLED_SYRINGE | INTRAMUSCULAR | Status: DC
  Administered 2023-11-03: 40 mg via SUBCUTANEOUS
  Filled 2023-11-03: qty 0.4

## 2023-11-03 MED ORDER — PANTOPRAZOLE SODIUM 40 MG PO TBEC
40.0000 mg | DELAYED_RELEASE_TABLET | Freq: Every day | ORAL | Status: DC
  Administered 2023-11-03 – 2023-11-04 (×2): 40 mg via ORAL
  Filled 2023-11-03: qty 1

## 2023-11-03 MED ORDER — POLYETHYLENE GLYCOL 3350 17 G PO PACK
17.0000 g | PACK | Freq: Every day | ORAL | Status: DC
  Administered 2023-11-03 – 2023-11-04 (×2): 17 g via ORAL
  Filled 2023-11-03: qty 1

## 2023-11-03 MED ORDER — POTASSIUM & SODIUM PHOSPHATES 280-160-250 MG PO PACK: 1.0000 | PACK | Freq: Three times a day (TID) | ORAL | Status: DC

## 2023-11-03 MED ORDER — SENNOSIDES-DOCUSATE SODIUM 8.6-50 MG PO TABS
2.0000 | ORAL_TABLET | Freq: Two times a day (BID) | ORAL | Status: DC
  Administered 2023-11-03 – 2023-11-04 (×3): 2 via ORAL
  Filled 2023-11-03 (×2): qty 2

## 2023-11-03 NOTE — Progress Notes (Signed)
 [facility]                                                                               Daniel Williams, is a 48 y.o. male, DOB - 10/24/1975, FMW:[id number] Admit date - 11/02/2023    Outpatient Primary MD for the patient is [name], MD  LOS - 0  days    Brief summary    Daniel Williams is a 48 y.o. male with medical history significant of hypertension, chronic lower back pain, partial epilepsy, history of psychogenic seizures, status post VNS stimulator who was brought to the emergency department via EMS due to seizures and dizziness.  EMS was called by his neighbors who saw him yelling outside.  EMS reported that he was confused when they arrived.  CT head without contrast with no acute intracranial normality.   Assessment & Plan    Assessment and Plan:   Seizures:  S/p VNS stimulator Possibly breakthrough seizures from noncompliance to medications Patient currently on Keppra  1500 mg daily, cenobamate  50 mg daily. Neurology on board   Hypertension Blood pressure parameters are optimal  Hypokalemia and hypophosphatemia Replaced   GERD Continue with Protonix  History of chronic back pain  Estimated body mass index is 16.24 kg/m as calculated from the following:   Height as of this encounter: 5' 9 (1.753 m).   Weight as of this encounter: 49.9 kg.  Code Status: full code.  DVT Prophylaxis:  enoxaparin (LOVENOX) injection 30 mg Start: 11/02/23 2200   Level of Care: Level of care: Progressive Family Communication: none at bedside.   Disposition Plan:     Remains inpatient appropriate:  pending.   Procedures:  None.   Consultants:   neurology  Antimicrobials:   Anti-infectives (From admission, onward)    None        Medications  Scheduled Meds:  amLODipine  5 mg Oral Daily   Cenobamate   50 mg Oral QHS   enoxaparin (LOVENOX) injection  30 mg Subcutaneous Q24H   levETIRAcetam   1,500 mg Oral Daily   pantoprazole  40 mg  Oral Q0600   polyethylene glycol  17 g Oral Daily   potassium chloride  40 mEq Oral Once   senna-docusate  2 tablet Oral BID   zonisamide   500 mg Oral QHS   Continuous Infusions: PRN Meds:.acetaminophen  **OR** acetaminophen , ondansetron  **OR** ondansetron  (ZOFRAN ) IV    Subjective:   Daniel Williams was seen and examined today.  Epigastric discomfort, no nausea or vomiting or abdominal pain.   Objective:   Vitals:   11/03/23 0349 11/03/23 0841 11/03/23 1200 11/03/23 1635  BP: (!) 147/103 (!) 128/103 (!) 139/100 (!) 149/92  Pulse: 73     Resp: 18 18 18 18   Temp: 97.7 F (36.5 C) 98 F (36.7 C) 98 F (36.7 C) 98.3 F (36.8 C)  TempSrc: Oral   Oral  SpO2: 100% 100% 100% 100%  Weight:      Height:        Intake/Output Summary (Last 24 hours) at 11/03/2023 1737 Last data filed at 11/02/2023 2300 Gross per 24 hour  Intake --  Output 200 ml  Net -200 ml  Filed Weights   11/02/23 0219  Weight: 49.9 kg     Exam General exam: Appears calm and comfortable  Respiratory system: Clear to auscultation. Respiratory effort normal. Cardiovascular system: S1 & S2 heard, RRR. No JVD,  Gastrointestinal system: Abdomen is nondistended, soft and nontender.  Central nervous system: Alert and oriented. Extremities: Symmetric 5 x 5 power. Skin: No rashes,  Psychiatry: Mood & affect appropriate.    Data Reviewed:  I have personally reviewed following labs and imaging studies   CBC Lab Results  Component Value Date   WBC 3.5 (L) 11/03/2023   RBC 4.45 11/03/2023   HGB 14.1 11/03/2023   HCT 40.1 11/03/2023   MCV 90.1 11/03/2023   MCH 31.7 11/03/2023   PLT 187 11/03/2023   MCHC 35.2 11/03/2023   RDW 11.7 11/03/2023   LYMPHSABS 1.8 11/02/2023   MONOABS 0.2 11/02/2023   EOSABS 0.0 11/02/2023   BASOSABS 0.0 11/02/2023     Last metabolic panel Lab Results  Component Value Date   NA 140 11/03/2023   K 3.5 11/03/2023   CL 103 11/03/2023   CO2 26 11/03/2023   BUN 11  11/03/2023   CREATININE 1.15 11/03/2023   GLUCOSE 104 (H) 11/03/2023   GFRNONAA >60 11/03/2023   GFRAA >60 07/22/2015   CALCIUM 9.4 11/03/2023   PHOS 2.1 (L) 11/02/2023   PROT 6.9 11/03/2023   ALBUMIN 3.9 11/03/2023   BILITOT 0.7 11/03/2023   ALKPHOS 59 11/03/2023   AST 12 (L) 11/03/2023   ALT 15 11/03/2023   ANIONGAP 11 11/03/2023    CBG (last 3)  No results for input(s): GLUCAP in the last 72 hours.    Coagulation Profile: No results for input(s): INR, PROTIME in the last 168 hours.   Radiology Studies: CT Head Wo Contrast Result Date: 11/02/2023 EXAM: CT HEAD WITHOUT CONTRAST 11/02/2023 04:01:24 AM TECHNIQUE: CT of the head was performed without the administration of intravenous contrast. Automated exposure control, iterative reconstruction, and/or weight based adjustment of the mA/kV was utilized to reduce the radiation dose to as low as reasonably achievable. COMPARISON: MRI of the head dated 04/04/2023. CLINICAL HISTORY: Seizure disorder, clinical change; seizure, has VNS stimulator in place. seizures and dizziness. Neighbors called EMS as patient was yelling outside of their home, unsure of how he got there. Upon EMS arrival pt very confused (postictal). Notes from chart; Hx of seizures, on medication, has VNS stimulator. FINDINGS: BRAIN AND VENTRICLES: No acute hemorrhage. No evidence of acute infarct. No hydrocephalus. No extra-axial collection. No mass effect or midline shift. ORBITS: No acute abnormality. SINUSES: There is mild polypoid mucosal disease present within the left sphenoid sinus and posterior ethmoid air cell. SOFT TISSUES AND SKULL: No acute soft tissue abnormality. No skull fracture. IMPRESSION: 1. No acute intracranial abnormality. 2. Mild polypoid mucosal disease in the left sphenoid sinus and posterior ethmoid air cells. Electronically signed by: Evalene Coho MD 11/02/2023 04:22 AM EDT RP Workstation: [id number]   DG Chest 1 View Result Date:  11/02/2023 EXAM: 1 VIEW(S) XRAY OF THE CHEST 11/02/2023 02:49:00 AM COMPARISON: 02/19/2008 CLINICAL HISTORY: seizure, VNS stimulator in place. Seizures FINDINGS: LINES, TUBES AND DEVICES: Vagal nerve stimulator on left chest. LUNGS AND PLEURA: No focal pulmonary opacity. No pulmonary edema. No pleural effusion. No pneumothorax. HEART AND MEDIASTINUM: No acute abnormality of the cardiac and mediastinal silhouettes. BONES AND SOFT TISSUES: No acute osseous abnormality. IMPRESSION: 1. No acute cardiopulmonary process. Electronically signed by: Pinkie Pebbles MD 11/02/2023 02:52 AM EDT RP Workstation: [id number]  Elgie Butter M.D. [facility] 11/03/2023, 5:37 PM  Available via Epic secure chat 7am-7pm After 7 pm, please refer to night coverage provider listed on amion.

## 2023-11-03 NOTE — Care Management Obs Status (Signed)
 MEDICARE OBSERVATION STATUS NOTIFICATION   Patient Details  Name: Daniel Williams MRN: [id number] Date of Birth: 1975/02/04   Medicare Observation Status Notification Given:  Yes    Shawnelle Spoerl G., RN 11/03/2023, 9:11 AM

## 2023-11-03 NOTE — Plan of Care (Signed)
  Problem: Health Behavior/Discharge Planning: Goal: Ability to manage health-related needs will improve Outcome: Progressing   Problem: Clinical Measurements: Goal: Will remain free from infection Outcome: Progressing   Problem: Clinical Measurements: Goal: Respiratory complications will improve Outcome: Progressing   Problem: Activity: Goal: Risk for activity intolerance will decrease Outcome: Progressing   Problem: Nutrition: Goal: Adequate nutrition will be maintained Outcome: Progressing   Problem: Coping: Goal: Level of anxiety will decrease Outcome: Progressing   Problem: Elimination: Goal: Will not experience complications related to bowel motility Outcome: Progressing   Problem: Elimination: Goal: Will not experience complications related to urinary retention Outcome: Progressing   Problem: Safety: Goal: Ability to remain free from injury will improve Outcome: Progressing

## 2023-11-03 NOTE — Plan of Care (Signed)

## 2023-11-04 DIAGNOSIS — E876 Hypokalemia: Secondary | ICD-10-CM | POA: Diagnosis not present

## 2023-11-04 DIAGNOSIS — I10 Essential (primary) hypertension: Secondary | ICD-10-CM | POA: Diagnosis not present

## 2023-11-04 DIAGNOSIS — R569 Unspecified convulsions: Secondary | ICD-10-CM | POA: Diagnosis not present

## 2023-11-04 LAB — LEVETIRACETAM LEVEL: Levetiracetam Lvl: 36.9 ug/mL (ref 10.0–40.0)

## 2023-11-04 LAB — CARBAMAZEPINE LEVEL, TOTAL: Carbamazepine Lvl: 3.9 ug/mL — ABNORMAL LOW (ref 4.0–12.0)

## 2023-11-04 MED ORDER — LEVETIRACETAM 750 MG PO TABS: 1500.0000 mg | ORAL_TABLET | Freq: Two times a day (BID) | ORAL | Status: DC

## 2023-11-04 NOTE — Plan of Care (Signed)
 Discussed with Dr. Cherlyn, patient is back to baseline now with no more dizziness etc., which likely was a symptom of carbamazepine  toxicity given elevated levels previously.  He has not been getting carbamazepine  here.  On review of the chart it seems like cenobamate  was the most recently added medication.  In review of drug interactions on Lexi comp coadministration of cenobamate  and carbamazepine  decreased carbamazepine  levels.  Also noted that patient was not taking Keppra  properly (once daily instead of twice daily)  Suggest careful counseling of patient/family to confirm patient is taking medications as prescribed in the future.  For now would resume prior antiseizure medication regimen as documented in last neurology note.  Consider use of blister packs or other mechanisms to ensure proper medication adherence  Close follow-up with Dr. Georjean  No charge plan of care note  Lola Jernigan MD-PhD [facility] [phone]

## 2023-11-04 NOTE — Progress Notes (Signed)
 RN spoke to mother Shanda and provided discharge instructions per patient's request. Patient's mother had no questions for RN to answer. RN removed PIV and tele monitor. CCMD called and notified of patient's discharge. Transportation has been called.

## 2023-11-04 NOTE — Plan of Care (Signed)
  Problem: Health Behavior/Discharge Planning: Goal: Ability to manage health-related needs will improve Outcome: Progressing   Problem: Clinical Measurements: Goal: Will remain free from infection Outcome: Progressing   Problem: Activity: Goal: Risk for activity intolerance will decrease Outcome: Progressing   Problem: Nutrition: Goal: Adequate nutrition will be maintained Outcome: Progressing   Problem: Elimination: Goal: Will not experience complications related to bowel motility Outcome: Progressing   Problem: Safety: Goal: Ability to remain free from injury will improve Outcome: Progressing   Problem: Skin Integrity: Goal: Risk for impaired skin integrity will decrease Outcome: Progressing

## 2023-11-04 NOTE — Plan of Care (Signed)
   Problem: Health Behavior/Discharge Planning: Goal: Ability to manage health-related needs will improve Outcome: Progressing   Problem: Clinical Measurements: Goal: Ability to maintain clinical measurements within normal limits will improve Outcome: Progressing   Problem: Activity: Goal: Risk for activity intolerance will decrease Outcome: Progressing   Problem: Nutrition: Goal: Adequate nutrition will be maintained Outcome: Progressing

## 2023-11-06 NOTE — Discharge Summary (Signed)
 Physician Discharge Summary   Patient: Daniel Williams MRN: [id number] DOB: 06/16/1975  Admit date:     11/02/2023  Discharge date: 11/04/2023  Discharge Physician: Elgie Butter   PCP: [name], MD   Recommendations at discharge:  Please follow up with pcp in one week.  Please follow up with neurology in one week.   Discharge Diagnoses: Principal Problem:   Seizures (HCC) Active Problems:   Hypertension   Hypokalemia   Hypophosphatemia  Resolved Problems:   * No resolved hospital problems. *  Hospital Course: Daniel Williams is a 48 y.o. male with medical history significant of hypertension, chronic lower back pain, partial epilepsy, history of psychogenic seizures, status post VNS stimulator who was brought to the emergency department via EMS due to seizures and dizziness.  EMS was called by his neighbors who saw him yelling outside.  EMS reported that he was confused when they arrived.  CT head without contrast with no acute intracranial normality.     Assessment and Plan:  Seizures:  S/p VNS stimulator Possibly breakthrough seizures from noncompliance to medications Patient currently on Keppra  1500 mg daily, cenobamate  50 mg daily. Neurology on board recommended that he was supposed to be on keppra  1500 BID, cenobamate , carbamazepine  300 mg in am and 600 mg at bedtime and zonisamide .  Recommended outpatient follow up with neurology.      Hypertension Blood pressure parameters are well controlled.    Hypokalemia and hypophosphatemia Replaced     GERD Continue with Protonix   History of chronic back pain   Estimated body mass index is 16.24 kg/m as calculated from the following:   Height as of this encounter: 5' 9 (1.753 m).   Weight as of this encounter: 49.9 kg.     Consultants: neurology. Procedures performed: none.   Disposition: Home Diet recommendation:  Regular diet DISCHARGE MEDICATION: Allergies as of 11/04/2023   No Known  Allergies      Medication List     STOP taking these medications    tiZANidine 4 MG tablet Commonly known as: ZANAFLEX       TAKE these medications    acetaminophen  500 MG tablet Commonly known as: TYLENOL  Take 1 tablet (500 mg total) by mouth every 6 (six) hours as needed for mild pain or moderate pain.   amLODipine 5 MG tablet Commonly known as: NORVASC Take 5 mg by mouth daily.   carbamazepine  300 MG 12 hr capsule Commonly known as: CARBATROL  Take 1 capsule in morning, 2 capsules in evening   levETIRAcetam  750 MG tablet Commonly known as: KEPPRA  Take 2 tablets (1,500 mg total) by mouth 2 (two) times daily. What changed: when to take this   Xcopri  50 MG Tabs Generic drug: Cenobamate  Take 1 tablet every night   zonisamide  100 MG capsule Commonly known as: ZONEGRAN  Take 5 capsules every night        Discharge Exam: Filed Weights   11/02/23 0219  Weight: 49.9 kg   General exam: Appears calm and comfortable  Respiratory system: Clear to auscultation. Respiratory effort normal. Cardiovascular system: S1 & S2 heard, RRR. No JVD, Gastrointestinal system: Abdomen is nondistended, soft and nontender.  Central nervous system: Alert and oriented. No focal neurological deficits. Extremities: Symmetric 5 x 5 power. Skin: No rashes, lesions or ulcers Psychiatry: Judgement and insight appear normal. Mood & affect appropriate.    Condition at discharge: fair  The results of significant diagnostics from this hospitalization (including imaging, microbiology, ancillary and laboratory) are  listed below for reference.   Imaging Studies: CT Head Wo Contrast Result Date: 11/02/2023 EXAM: CT HEAD WITHOUT CONTRAST 11/02/2023 04:01:24 AM TECHNIQUE: CT of the head was performed without the administration of intravenous contrast. Automated exposure control, iterative reconstruction, and/or weight based adjustment of the mA/kV was utilized to reduce the radiation dose to as  low as reasonably achievable. COMPARISON: MRI of the head dated 04/04/2023. CLINICAL HISTORY: Seizure disorder, clinical change; seizure, has VNS stimulator in place. seizures and dizziness. Neighbors called EMS as patient was yelling outside of their home, unsure of how he got there. Upon EMS arrival pt very confused (postictal). Notes from chart; Hx of seizures, on medication, has VNS stimulator. FINDINGS: BRAIN AND VENTRICLES: No acute hemorrhage. No evidence of acute infarct. No hydrocephalus. No extra-axial collection. No mass effect or midline shift. ORBITS: No acute abnormality. SINUSES: There is mild polypoid mucosal disease present within the left sphenoid sinus and posterior ethmoid air cell. SOFT TISSUES AND SKULL: No acute soft tissue abnormality. No skull fracture. IMPRESSION: 1. No acute intracranial abnormality. 2. Mild polypoid mucosal disease in the left sphenoid sinus and posterior ethmoid air cells. Electronically signed by: Evalene Coho MD 11/02/2023 04:22 AM EDT RP Workstation: [id number]   DG Chest 1 View Result Date: 11/02/2023 EXAM: 1 VIEW(S) XRAY OF THE CHEST 11/02/2023 02:49:00 AM COMPARISON: 02/19/2008 CLINICAL HISTORY: seizure, VNS stimulator in place. Seizures FINDINGS: LINES, TUBES AND DEVICES: Vagal nerve stimulator on left chest. LUNGS AND PLEURA: No focal pulmonary opacity. No pulmonary edema. No pleural effusion. No pneumothorax. HEART AND MEDIASTINUM: No acute abnormality of the cardiac and mediastinal silhouettes. BONES AND SOFT TISSUES: No acute osseous abnormality. IMPRESSION: 1. No acute cardiopulmonary process. Electronically signed by: Pinkie Pebbles MD 11/02/2023 02:52 AM EDT RP Workstation: [id number]    Microbiology: No results found for this or any previous visit.  Labs: CBC: Recent Labs  Lab 11/01/23 1349 11/02/23 0232 11/03/23 0611  WBC 4.5 4.7 3.5*  NEUTROABS  --  2.7  --   HGB 16.0 14.3 14.1  HCT 45.4 42.2 40.1  MCV 90.8 92.7 90.1  PLT 190  182 187   Basic Metabolic Panel: Recent Labs  Lab 11/01/23 1349 11/02/23 0232 11/02/23 0921 11/03/23 0611  NA 138 140  --  140  K 3.9 3.2*  --  3.5  CL 101 103  --  103  CO2 24 25  --  26  GLUCOSE 104* 110*  --  104*  BUN 14 14  --  11  CREATININE 1.28* 1.11  --  1.15  CALCIUM 9.6 9.0  --  9.4  MG  --   --  2.0  --   PHOS  --   --  2.1*  --    Liver Function Tests: Recent Labs  Lab 11/01/23 1349 11/02/23 0232 11/03/23 0611  AST 18 19 12*  ALT 20 18 15   ALKPHOS 66 73 59  BILITOT 0.9 0.3 0.7  PROT 7.7 7.1 6.9  ALBUMIN 4.8 4.6 3.9   CBG: No results for input(s): GLUCAP in the last 168 hours.  Discharge time spent: 36 minutes.   Signed: Elgie Butter, MD [facility] 11/06/2023

## 2023-11-07 ENCOUNTER — Telehealth: Payer: Self-pay | Admitting: Neurology

## 2023-11-07 NOTE — Telephone Encounter (Signed)
Pt has been added to the schedule

## 2023-11-07 NOTE — Telephone Encounter (Signed)
 Who's calling (name and relationship to patient) : Becca; Parks, PCP   Best contact number: [phone]  Provider they see: Dr.[name]  Reason for call: Daniel Williams called in wanting to speak with someone regarding neuro issues with the stimulator. She stated that he has been having dizziness, and was in the hospital. Daniel Williams is having significant neuro issues and is currently in the office now.   Call routed to [facility].    Call ID:      PRESCRIPTION REFILL ONLY  Name of prescription:  Pharmacy:

## 2023-11-07 NOTE — Telephone Encounter (Signed)
 Spoke to Dr. Pura. Patient is in his office with a change in exam. His gait is shuffling, he denies blurred vision but feels his eyes are not aligned. It is difficult to pin him down on medication intake but he says he is taking 4 seizure medications. Discussed checking carbamazepine  level (it was supratherapeutic on 10/17, I wonder about how he is taking his medications). I have an opening on Friday at 1:30pm, ok to put him there for eval.

## 2023-11-09 ENCOUNTER — Ambulatory Visit (INDEPENDENT_AMBULATORY_CARE_PROVIDER_SITE_OTHER): Admitting: Neurology

## 2023-11-09 ENCOUNTER — Encounter: Payer: Self-pay | Admitting: Neurology

## 2023-11-09 ENCOUNTER — Telehealth: Payer: Self-pay | Admitting: Neurology

## 2023-11-09 VITALS — BP 170/118 | HR 82 | Ht 69.0 in | Wt 119.0 lb

## 2023-11-09 DIAGNOSIS — G40219 Localization-related (focal) (partial) symptomatic epilepsy and epileptic syndromes with complex partial seizures, intractable, without status epilepticus: Secondary | ICD-10-CM | POA: Diagnosis not present

## 2023-11-09 MED ORDER — CARBAMAZEPINE ER 300 MG PO CP12: ORAL_CAPSULE | ORAL | 3 refills | Status: DC

## 2023-11-09 MED ORDER — LEVETIRACETAM 750 MG PO TABS: 1500.0000 mg | ORAL_TABLET | Freq: Two times a day (BID) | ORAL | 3 refills | Status: DC

## 2023-11-09 MED ORDER — ZONISAMIDE 100 MG PO CAPS: ORAL_CAPSULE | ORAL | 3 refills | Status: DC

## 2023-11-09 MED ORDER — XCOPRI 50 MG PO TABS: ORAL_TABLET | ORAL | 3 refills | Status: DC

## 2023-11-09 NOTE — Patient Instructions (Signed)
 Hi Daniel Williams,  The level of carbamazepine  in your body is high. Please reduce the Carbamazepine  300mg : Take ONE capsule every morning, ONE capsule every night  Have bloodwork done for carbamazepine  level on Monday  Continue all your other medications. I asked [facility] to place them in pillpacks to help make taking it easier  Follow-up as scheduled in January, call for any changes

## 2023-11-09 NOTE — Progress Notes (Signed)
 NEUROLOGY FOLLOW UP OFFICE NOTE  Daniel Williams [id number] Jun 24, 1975  HISTORY OF PRESENT ILLNESS: I had the pleasure of seeing Daniel Williams in follow-up in the neurology clinic on 11/09/2023.  The patient was last seen 3 weeks ago for coexisting intractable epilepsy s/p VNS placement and functional seizures. He is alone in the office today.  Records and images were personally reviewed where available.  On his last visit, VNS output current was increased to 0.626mA. He reported running out of Xcopri  that day, however PDMP review indicated his last refill for Xcopri  was 6/25 (running out in July). He is also on Zonisamide  500mg  at bedtime, carbamazepine  ER 300mg  1 cap in AM, 2 caps in PM and Levetiracetam  750mg  2 tabs BID. He was in the ER on 10/16 for dizziness. The next day, he was brought to the ER by EMS after neighbors saw him yelling outside. He denied any seizures but reported weird dreams. Head CT no acute changes. Carbamazepine  level was 19.2 mcg/mL, carbamazepine  was held during his admission. Level on discharge 10/19 was 3.9. He saw his PCP on 10/22 with note of gait disturbance and visual changes that he had difficulty describing. Repeat carbamazepine  level was again high 18.6.  He states that the dizziness is still present, a little. He points to his forehead where the dizziness is felt. He also reports headaches over the right frontal region. He states his vision is like unstable, he can see but he is not coordinated, demonstrating that he has difficulty with tandem walk. He denies any spinning sensation or lightheadedness. No nausea/vomiting, the stomach issues have basically resolved. He reports sleep difficulty, noise outside wakes him up from time to time.   10/2023: Keppra  level 36.9   History On Initial Assessment 05/18/2022: This is a 48 year old right-handed man with a history of hypertension, co-existing intractable epilepsy s/p VNS implantation and non-epileptic  events, presenting to establish care. Records were reviewed. He reports seizures started in childhood. Sometimes he gets a feeling and knows he needs to sit down, or his eyes start messing up, then he wakes up with injuries. He always bites inside of his gums on the left, last occurrence was a couple of weeks ago. He reports waking up with injuries on his back, left was a couple of weeks ago. He lives alone. No focal weakness, he feels drained and tired after. He has difficulty determining seizure frequency but states he does not have them like before, he thinks he had 4 seizures in the last 2 months. Seizure triggers include stress, getting worked up, heat. He denies any olfactory/gustatory hallucinations, deja vu, rising epigastric sensation, focal numbness/tingling/weakness, myoclonic jerks. He has migraines once in a while with good response to Tylenol  or Ibuprofen. No nausea/vomiting, photo/phonophobia. There is a strong family history of migraines in his mother, maternal grandfather, and paternal grandmother). No dizziness, diplopia, dysarthria/dysphagia, neck pain. He has some back pain and occasional constipation. He gets around 12 hours of sleep. He lives alone. 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 He does not drive.   Records from [facility] were reviewed, last visit with epileptologist Dr. Donnetta was in 03/2022. Per records, Zonisamide  was increased to 500mg  a day after level of 14 in 05/2021. Carbamazepine  level was 12.3, Levetiracetam  level 31.3. He is on Carbatrol  300mg  in AM, 600mg  in PM, Levetiracetam  1500mg  BID, and states he has been taking Zonisamide  400mg  qhs. VNS was placed in 2008, replaced  in 2019. His last vEEG monitoring in 03/2005 showed interictal discharges in various regions throughout the right hemisphere as well as run of rhythmic activity in the right central temporal head region, which electrographically was at least suspicious for being  electrographic seizure. In 1998, vEEG captured 14 events, 13 had no electrographic correlate and felt to be non-epileptic seizures. One electrographic seizure was seen with onset in the occipital head region and was consistent with a complex partial seizure with secondary generalization. 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   Epilepsy Risk Factors:  His paternal grandmother had blackouts but was not diagnosed with seizures. He was in special education classes, finished 12th grade and did some college. There is no history of febrile convulsions, CNS infections such as meningitis/encephalitis, significant traumatic brain injury, neurosurgical procedures.  Prior ASMs: Depakote, Diamox, Neurontin, Topamax, Dilantin, and Lamictal.  Diagnostic Data: MRI brain with and without contrast 03/2023: no acute changes, hippocampi symmetric with no abnormal signal or enhancement  vEEG monitoring in 03/2005 showed interictal discharges in various regions throughout the right hemisphere as well as run of rhythmic activity in the right central temporal head region, which electrographically was at least suspicious for being electrographic seizure. In 1998, vEEG captured 14 events, 13 had no electrographic correlate and felt to be non-epileptic seizures. One electrographic seizure was seen with onset in the occipital head region and was consistent with a complex partial seizure with secondary generalization.  1-hour EEG in 05/2022 showed occasional focal slowing over the right temporal region, frequent right frontotemporal epileptiform discharges   PAST MEDICAL HISTORY: Past Medical History:  Diagnosis Date   Hypertension    Seizures (HCC)     MEDICATIONS: Current Outpatient Medications on File Prior to Visit  Medication Sig Dispense Refill    acetaminophen  (TYLENOL ) 500 MG tablet Take 1 tablet (500 mg total) by mouth every 6 (six) hours as needed for mild pain or moderate pain. 30 tablet 0   amLODipine (NORVASC) 5 MG tablet Take 5 mg by mouth daily.     carbamazepine  (CARBATROL ) 300 MG 12 hr capsule Take 1 capsule in morning, 2 capsules in evening 270 capsule 3   Cenobamate  (XCOPRI ) 50 MG TABS Take 1 tablet every night 90 tablet 3   levETIRAcetam  (KEPPRA ) 750 MG tablet Take 2 tablets (1,500 mg total) by mouth 2 (two) times daily. (Patient taking differently: Take 1,500 mg by mouth in the morning and at bedtime.) 360 tablet 3   zonisamide  (ZONEGRAN ) 100 MG capsule Take 5 capsules every night 450 capsule 3   No current facility-administered medications on file prior to visit.    ALLERGIES: No Known Allergies  FAMILY HISTORY: Family History  Problem Relation Age of Onset   Colon cancer Neg Hx    Colon polyps Neg Hx    Esophageal cancer Neg Hx    Rectal cancer Neg Hx    Stomach cancer Neg Hx     SOCIAL HISTORY: Social History   Socioeconomic History   Marital status: Single    Spouse name: Not on file   Number of children: Not on file   Years of education: Not on file   Highest education level: Not on file  Occupational History   Not on file  Tobacco Use   Smoking status: Never   Smokeless tobacco: Never  Vaping Use   Vaping status:  Never Used  Substance and Sexual Activity   Alcohol use: No   Drug use: Not Currently   Sexual activity: Not on file  Other Topics Concern   Not on file  Social History Narrative   Are you right handed or left handed? Right    Are you currently employed ? 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 yes   Who lives with you? NA   What type of home do you live in: 1 story or 2 story? 1 story        Social Drivers of Corporate Investment Banker Strain: Low Risk  (11/07/2023)   Received from [facility]   Overall Financial Resource  Strain (CARDIA)    How hard is it for you to pay for the very basics like food, housing, medical care, and heating?: Not hard at all  Food Insecurity: No Food Insecurity (11/07/2023)   Received from [facility]   Hunger Vital Sign    Within the past 12 months, you worried that your food would run out before you got the money to buy more.: Never true    Within the past 12 months, the food you bought just didn't last and you didn't have money to get more.: Never true  Transportation Needs: No Transportation Needs (11/07/2023)   Received from [facility] - Transportation    In the past 12 months, has lack of transportation kept you from medical appointments or from getting medications?: No    In the past 12 months, has lack of transportation kept you from meetings, work, or from getting things needed for daily living?: No  Physical Activity: Inactive (11/07/2023)   Received from [facility]   Exercise Vital Sign    On average, how many days per week do you engage in moderate to strenuous exercise (like a brisk walk)?: 0 days    Minutes of Exercise per Session: Not on file  Stress: Stress Concern Present (11/07/2023)   Received from [facility] - Occupational Stress Questionnaire    Do you feel stress - tense, restless, nervous, or anxious, or unable to sleep at night because your mind is troubled all the time - these days?: Rather much  Social Connections: Socially Integrated (11/07/2023)   Received from [facility]   Social Network    How would you rate your social network (family, work, friends)?: Good participation with social networks  Intimate Partner Violence: Not At Risk (11/07/2023)   Received from [facility]   HITS    Over the last 12 months how often did your partner physically hurt you?: Never    Over the last 12 months how often did your partner insult you or talk down to you?: Never    Over the last 12 months  how often did your partner threaten you with physical harm?: Never    Over the last 12 months how often did your partner scream or curse at you?: Never     PHYSICAL EXAM: Vitals:   11/09/23 1256  BP: (!) 170/118  Pulse: 82  SpO2: 100%   General: No acute distress Head:  Normocephalic/atraumatic Skin/Extremities: No rash, no edema Neurological Exam: alert and awake. No aphasia or dysarthria. Fund of knowledge is appropriate. Attention and concentration are normal.   Cranial nerves: Pupils equal, round. Extraocular movements intact, there is gaze-evoked nystagmus on horizontal gaze  bilaterally. Visual fields full.  No facial asymmetry.  Motor: Bulk and tone normal, muscle strength 5/5 throughout with no pronator drift.   Finger to nose testing intact.  Gait slow and cautious, he is able to demonstrate tandem walk without ataxia noted.   VNS Therapy Management: Parameters Output Current (mA): 0.625 Signal Frequency (Hz): 30 Pulse Width (usec): 250 Signal ON Time (sec): 30 Signal OFF Time (min): 3 Magnet Output Current (mA): 0.75 Magnet ON Time (sec): 60 Magnet Pulse Width (usec): 250 Diagnostics Current Delievered (mA): 0.625 Impedence Value (Ohms): 3320 Battery Status Indicator (color): Green (75-100%)   IMPRESSION: This is a 48 yo RH man with a history of hypertension, co-existing intractable epilepsy s/p VNS implantation and non-epileptic events. Prior EEG reported interictal discharges in various regions throughout the right hemisphere as well as run of rhythmic activity in the right central temporal head region, most recent EEG showed frequent right frontotemporal epileptiform discharges. MRI brain normal. He presents for an earlier visit due to dizziness, incoordination, gait imbalance. His carbamazepine  level was supratherapeutic in the ER (19.2) on 10/16, medication was held and level was 3.9 on hospital discharge 10/19, however at PCP office 10/22, level was again  supratherapeutic 18.6 while he was symptomatic. Discussed reducing Carbamazepine  ER 300mg : take 1 capsule twice a day, recheck carbamazepine  level in 3 days. Continue Xcopri  50mg  at bedtime, Zonisamide  500mg  at bedtime, and Levetiracetam  1500mg  BID (750mg  2 tabs BID). I requested [facility] to dispense medications in pillpacks to help with compliance and track down correct medication intake. VNS interrogated, no changes made. The VNS changes made on last visit would not cause his symptoms. BP today elevated, continue to monitor at home and with PCP. Follow-up as scheduled in January, call for any changes.   Thank you for allowing me to participate in his care.  Please do not hesitate to call for any questions or concerns.    Darice Shivers, M.D.   CC: Dr. Pura

## 2023-11-09 NOTE — Telephone Encounter (Signed)
 Daniel Williams was called back she was checking to see if we had gotten the labs that they faxed over

## 2023-11-09 NOTE — Telephone Encounter (Signed)
 Destiny with [facility] called and LM with AN. She needs to speak with renee about pt's results

## 2023-11-12 ENCOUNTER — Other Ambulatory Visit: Payer: Self-pay

## 2023-11-12 ENCOUNTER — Other Ambulatory Visit

## 2023-11-12 DIAGNOSIS — G40219 Localization-related (focal) (partial) symptomatic epilepsy and epileptic syndromes with complex partial seizures, intractable, without status epilepticus: Secondary | ICD-10-CM

## 2023-11-13 ENCOUNTER — Ambulatory Visit: Payer: Self-pay | Admitting: Neurology

## 2023-11-13 LAB — CARBAMAZEPINE LEVEL, TOTAL: Carbamazepine Lvl: 9.8 mg/L (ref 4.0–12.0)

## 2023-11-13 NOTE — Telephone Encounter (Signed)
-----   Message from Darice CHRISTELLA Shivers sent at 11/13/2023  9:08 AM EDT ----- Pls let him know the bloodwork looks good now with the adjustment in dose that we made. I hope he is feeling better with medication adjustment. Thanks ----- Message ----- From: Rebecka Hose Lab Results In Sent: 11/13/2023   7:47 AM EDT To: Darice CHRISTELLA Shivers, MD

## 2023-11-13 NOTE — Telephone Encounter (Signed)
 Pt called an informed that the bloodwork looks good now with the adjustment in dose that we made. Dr Cyrilla  hopes he is feeling better with medication adjustment. Pt stated that he feels much better with the adjustments,

## 2023-11-27 ENCOUNTER — Encounter: Payer: Self-pay | Admitting: Neurology

## 2024-01-25 ENCOUNTER — Telehealth: Payer: Self-pay | Admitting: Neurology

## 2024-01-25 ENCOUNTER — Telehealth: Payer: Self-pay

## 2024-01-25 NOTE — Telephone Encounter (Signed)
 Pt needs an urgent PA for his XCOPRI 

## 2024-01-25 NOTE — Telephone Encounter (Signed)
 ACCESS NURSE: Pt cld for Rx refill on Xcopril 12.5 mill, he has had couple of seizures being out of his seizure Rx. Declined Triage

## 2024-01-26 MED ORDER — XCOPRI 50 MG PO TABS: ORAL_TABLET | ORAL | 0 refills | Status: DC

## 2024-01-26 MED ORDER — XCOPRI 50 MG PO TABS: ORAL_TABLET | ORAL | 3 refills | Status: DC

## 2024-01-26 NOTE — Telephone Encounter (Signed)
 Rx sent both to [facility]

## 2024-01-28 ENCOUNTER — Other Ambulatory Visit (HOSPITAL_COMMUNITY): Payer: Self-pay

## 2024-01-28 NOTE — Telephone Encounter (Signed)
 No PA needed. Last filled on 1.10.26

## 2024-02-06 ENCOUNTER — Ambulatory Visit: Admitting: Neurology

## 2024-02-06 VITALS — BP 108/72 | HR 72 | Ht 69.0 in | Wt 125.8 lb

## 2024-02-06 DIAGNOSIS — G40219 Localization-related (focal) (partial) symptomatic epilepsy and epileptic syndromes with complex partial seizures, intractable, without status epilepticus: Secondary | ICD-10-CM

## 2024-02-06 MED ORDER — XCOPRI 50 MG PO TABS: ORAL_TABLET | ORAL | 3 refills | Status: AC

## 2024-02-06 MED ORDER — ZONISAMIDE 100 MG PO CAPS: ORAL_CAPSULE | ORAL | 3 refills | Status: AC

## 2024-02-06 MED ORDER — CARBAMAZEPINE ER 300 MG PO CP12: ORAL_CAPSULE | ORAL | 3 refills | Status: AC

## 2024-02-06 MED ORDER — LEVETIRACETAM 750 MG PO TABS: 1500.0000 mg | ORAL_TABLET | Freq: Two times a day (BID) | ORAL | 3 refills | Status: AC

## 2024-02-06 NOTE — Patient Instructions (Signed)
 It's good to see you. Continue all your medications. Follow-up in 6 months, call for any changes.    Seizure Precautions: 1. If medication has been prescribed for you to prevent seizures, take it exactly as directed.  Do not stop taking the medicine without talking to your doctor first, even if you have not had a seizure in a long time.   2. Avoid activities in which a seizure would cause danger to yourself or to others.  Don't operate dangerous machinery, swim alone, or climb in high or dangerous places, such as on ladders, roofs, or girders.  Do not drive unless your doctor says you may.  3. If you have any warning that you may have a seizure, lay down in a safe place where you can't hurt yourself.    4.  No driving for 6 months from last seizure, as per [place]  state law.   Please refer to the following link on the Epilepsy Foundation of America's website for more information: http://www.epilepsyfoundation.org/answerplace/Social/driving/drivingu.cfm   5.  Maintain good sleep hygiene.   6.  Contact your doctor if you have any problems that may be related to the medicine you are taking.  7.  Call 911 and bring the patient back to the ED if:        A.  The seizure lasts longer than 5 minutes.       B.  The patient doesn't awaken shortly after the seizure  C.  The patient has new problems such as difficulty seeing, speaking or moving  D.  The patient was injured during the seizure  E.  The patient has a temperature over 102 F (39C)  F.  The patient vomited and now is having trouble breathing

## 2024-02-06 NOTE — Progress Notes (Signed)
 "  NEUROLOGY FOLLOW UP OFFICE NOTE  Daniel Williams [id number] 1975/05/28  Discussed the use of AI scribe software for clinical note transcription with the patient, who gave verbal consent to proceed.  History of Present Illness I had the pleasure of seeing Daniel Williams in follow-up in the neurology clinic on 02/06/2024.  The patient was last seen 3 months ago for intractable epilepsy s/p VNS placement. He is alone in the office today. Records and images were personally reviewed where available.  On his last visit, Carbamazepine  ER dose reduced due to symptomatic supratherapeutic levels, he is on 300mg  BID with repeat level on 11/12/23 of 9.8. He is also on Zonisamide  500mg  at bedtime, and Levetiracetam  1500mg  BID (750mg  2 tabs BID), and Xcopri  50mg  at bedtime. He has been having difficulty obtaining the Xcopri  and reports 3 seizures since he ran out. He has difficulty recalling when the last seizure was but knows he has had 3. He is doing well with no side effects on current regimen. He has had falls with the seizures and bitten his tongue.   He mentions having a migraine headache the previous day, for which he took Tylenol . The headache was severe enough to require rest. No recent dizziness or double vision is reported. He discusses sleep disturbances, noting that noise can prevent him from sleeping well, which negatively affects his condition.   History On Initial Assessment 05/18/2022: This is a 49 year old right-handed man with a history of hypertension, co-existing intractable epilepsy s/p VNS implantation and non-epileptic events, presenting to establish care. Records were reviewed. He reports seizures started in childhood. Sometimes he gets a feeling and knows he needs to sit down, or his eyes start messing up, then he wakes up with injuries. He always bites inside of his gums on the left, last occurrence was a couple of weeks ago. He reports waking up with injuries on his back, left was a  couple of weeks ago. He lives alone. No focal weakness, he feels drained and tired after. He has difficulty determining seizure frequency but states he does not have them like before, he thinks he had 4 seizures in the last 2 months. Seizure triggers include stress, getting worked up, heat. He denies any olfactory/gustatory hallucinations, deja vu, rising epigastric sensation, focal numbness/tingling/weakness, myoclonic jerks. He has migraines once in a while with good response to Tylenol  or Ibuprofen. No nausea/vomiting, photo/phonophobia. There is a strong family history of migraines in his mother, maternal grandfather, and paternal grandmother). No dizziness, diplopia, dysarthria/dysphagia, neck pain. He has some back pain and occasional constipation. He gets around 12 hours of sleep. He lives alone. 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 He does not drive.   Records from [facility] were reviewed, last visit with epileptologist Dr. Donnetta was in 03/2022. Per records, Zonisamide  was increased to 500mg  a day after level of 14 in 05/2021. Carbamazepine  level was 12.3, Levetiracetam  level 31.3. He is on Carbatrol  300mg  in AM, 600mg  in PM, Levetiracetam  1500mg  BID, and states he has been taking Zonisamide  400mg  qhs. VNS was placed in 2008, replaced in 2019. His last vEEG monitoring in 03/2005 showed interictal discharges in various regions throughout the right hemisphere as well as run of rhythmic activity in the right central temporal head region, which electrographically was at least suspicious for being electrographic seizure. In 1998, vEEG captured 14 events, 13 had no electrographic correlate and felt to be non-epileptic seizures. One electrographic seizure  was seen with onset in the occipital head region and was consistent with a complex partial seizure with secondary generalization. 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   Epilepsy Risk Factors:  His paternal grandmother had blackouts but was not diagnosed with seizures. He was in special education classes, finished 12th grade and did some college. There is no history of febrile convulsions, CNS infections such as meningitis/encephalitis, significant traumatic brain injury, neurosurgical procedures.  Prior ASMs: Depakote, Diamox, Neurontin, Topamax, Dilantin, and Lamictal.  Diagnostic Data: MRI brain with and without contrast 03/2023: no acute changes, hippocampi symmetric with no abnormal signal or enhancement  vEEG monitoring in 03/2005 showed interictal discharges in various regions throughout the right hemisphere as well as run of rhythmic activity in the right central temporal head region, which electrographically was at least suspicious for being electrographic seizure. In 1998, vEEG captured 14 events, 13 had no electrographic correlate and felt to be non-epileptic seizures. One electrographic seizure was seen with onset in the occipital head region and was consistent with a complex partial seizure with secondary generalization.  1-hour EEG in 05/2022 showed occasional focal slowing over the right temporal region, frequent right frontotemporal epileptiform discharges   PAST MEDICAL HISTORY: Past Medical History:  Diagnosis Date   Hypertension    Seizures (HCC)     MEDICATIONS: Medications Ordered Prior to Encounter[1]  ALLERGIES: Allergies[2]  FAMILY HISTORY: Family History  Problem Relation Age of Onset   Colon cancer Neg Hx    Colon polyps Neg Hx    Esophageal cancer Neg Hx    Rectal cancer Neg Hx    Stomach cancer Neg Hx     SOCIAL HISTORY: Social History   Socioeconomic History   Marital status: Single    Spouse name: Not on file   Number of children: Not on file   Years of education: Not on file   Highest  education level: Not on file  Occupational History   Not on file  Tobacco Use   Smoking status: Never   Smokeless tobacco: Never  Vaping Use   Vaping status: Never Used  Substance and Sexual Activity   Alcohol use: No   Drug use: Not Currently   Sexual activity: Not on file  Other Topics Concern   Not on file  Social History Narrative   Are you right handed or left handed? Right    Are you currently employed ? 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 yes   Who lives with you? NA   What type of home do you live in: 1 story or 2 story? 1 story        Social Drivers of Health   Tobacco Use: Low Risk (11/09/2023)   Patient History    Smoking Tobacco Use: Never    Smokeless Tobacco Use: Never    Passive Exposure: Not on file  Financial Resource Strain: Low Risk (11/07/2023)   Received from [facility]   Overall Financial Resource Strain (CARDIA)    How hard is it for you to pay for the very basics like food, housing, medical care, and heating?: Not hard at all  Food Insecurity: No Food Insecurity (11/07/2023)   Received from [facility]   Epic    Within the past 12 months, you worried that  your food would run out before you got the money to buy more.: Never true    Within the past 12 months, the food you bought just didn't last and you didn't have money to get more.: Never true  Transportation Needs: No Transportation Needs (11/07/2023)   Received from [facility]    In the past 12 months, has lack of transportation kept you from medical appointments or from getting medications?: No    In the past 12 months, has lack of transportation kept you from meetings, work, or from getting things needed for daily living?: No  Physical Activity: Inactive (11/07/2023)   Received from [facility]   Exercise Vital Sign    On average, how many days per week do you engage in moderate to strenuous exercise (like a brisk walk)?: 0  days    Minutes of Exercise per Session: Not on file  Stress: Stress Concern Present (11/07/2023)   Received from [facility] - Occupational Stress Questionnaire    Do you feel stress - tense, restless, nervous, or anxious, or unable to sleep at night because your mind is troubled all the time - these days?: Rather much  Social Connections: Socially Integrated (11/07/2023)   Received from [facility]   Social Network    How would you rate your social network (family, work, friends)?: Good participation with social networks  Intimate Partner Violence: Not At Risk (11/07/2023)   Received from [facility]   HITS    Over the last 12 months how often did your partner physically hurt you?: Never    Over the last 12 months how often did your partner insult you or talk down to you?: Never    Over the last 12 months how often did your partner threaten you with physical harm?: Never    Over the last 12 months how often did your partner scream or curse at you?: Never  Depression (PHQ2-9): Not on file  Alcohol Screen: Not on file  Housing: Low Risk (11/07/2023)   Received from [facility]    In the last 12 months, was there a time when you were not able to pay the mortgage or rent on time?: No    In the past 12 months, how many times have you moved where you were living?: 0    At any time in the past 12 months, were you homeless or living in a shelter (including now)?: No  Utilities: Not At Risk (11/07/2023)   Received from [facility]    In the past 12 months has the electric, gas, oil, or water company threatened to shut off services in your home?: No  Health Literacy: Not on file     PHYSICAL EXAM: Vitals:   02/06/24 1328  BP: 108/72  Pulse: 72  SpO2: 99%   General: No acute distress Head:  Normocephalic/atraumatic Skin/Extremities: No rash, no edema Neurological Exam: alert and awake. No aphasia or dysarthria. Fund  of knowledge is appropriate.  Attention and concentration are normal.   Cranial nerves: Pupils equal, round. Extraocular movements intact with no nystagmus. Visual fields full.  No facial asymmetry.  Motor: Bulk and tone normal, muscle strength 5/5 throughout with no pronator drift.   Finger to nose testing intact.  Gait narrow-based and steady, able to tandem walk adequately.  Romberg negative.  VNS Therapy Management: Parameters Output Current (mA): 0.625 Signal Frequency (Hz): 30 Pulse  Width (usec): 250 Signal ON Time (sec): 39 Signal OFF Time (min): 3 Magnet Output Current (mA): 0.75 Magnet ON Time (sec): 60 Magnet Pulse Width (usec): 250 Diagnostics Current Delievered (mA): 0.625 Impedence Value (Ohms): 3400 Battery Status Indicator (color): Green (75-100%)    IMPRESSION: This is a 49 yo RH man with a history of hypertension, co-existing intractable epilepsy s/p VNS implantation and non-epileptic events. Prior EEG reported interictal discharges in various regions throughout the right hemisphere as well as run of rhythmic activity in the right central temporal head region, most recent EEG showed frequent right frontotemporal epileptiform discharges. MRI brain normal. He denies any side effects on current regimen and reports 3 seizures since running out of Xcopri . Refills were sent in 4 days ago, continue Xcopri  50mg  at bedtime, Zonisamide  500mg  at bedtime, Carbamazepine  ER 300mg  BID, and Levetiracetam  1500mg  BID (750mg  2 tabs BID). VNS interrogated, no changes made today. He does not drive. Follow-up in 6 months, call for any changes.   Thank you for allowing me to participate in his care.  Please do not hesitate to call for any questions or concerns.    Darice Shivers, M.D.   CC: Dr. Pura       [1]  Current Outpatient Medications on File Prior to Visit  Medication Sig Dispense Refill   acetaminophen  (TYLENOL ) 500 MG tablet Take 1 tablet (500 mg total) by mouth every 6 (six)  hours as needed for mild pain or moderate pain. 30 tablet 0   amLODipine  (NORVASC ) 5 MG tablet Take 5 mg by mouth daily.     carbamazepine  (CARBATROL ) 300 MG 12 hr capsule Take 1 capsule in morning, 1 capsule in evening 180 capsule 3   Cenobamate  (XCOPRI ) 50 MG TABS Take 1 tablet every night 90 tablet 3   levETIRAcetam  (KEPPRA ) 750 MG tablet Take 2 tablets (1,500 mg total) by mouth 2 (two) times daily. 360 tablet 3   zonisamide  (ZONEGRAN ) 100 MG capsule Take 5 capsules every night 450 capsule 3   No current facility-administered medications on file prior to visit.  [2] No Known Allergies  "

## 2024-02-10 ENCOUNTER — Encounter: Payer: Self-pay | Admitting: Neurology

## 2024-08-08 ENCOUNTER — Ambulatory Visit: Payer: Self-pay | Admitting: Neurology
